# Patient Record
Sex: Female | Born: 1940 | Race: White | Hispanic: No | Smoking: Never smoker
Health system: Southern US, Community
[De-identification: ages and names within clinical notes are randomized; demographics above are authoritative.]

## PROBLEM LIST (undated history)

## (undated) DIAGNOSIS — M1990 Unspecified osteoarthritis, unspecified site: Secondary | ICD-10-CM

## (undated) DIAGNOSIS — F32A Depression, unspecified: Secondary | ICD-10-CM

## (undated) DIAGNOSIS — I509 Heart failure, unspecified: Secondary | ICD-10-CM

## (undated) DIAGNOSIS — J449 Chronic obstructive pulmonary disease, unspecified: Secondary | ICD-10-CM

## (undated) DIAGNOSIS — I10 Essential (primary) hypertension: Secondary | ICD-10-CM

## (undated) DIAGNOSIS — F419 Anxiety disorder, unspecified: Secondary | ICD-10-CM

## (undated) DIAGNOSIS — F329 Major depressive disorder, single episode, unspecified: Secondary | ICD-10-CM

## (undated) DIAGNOSIS — E119 Type 2 diabetes mellitus without complications: Secondary | ICD-10-CM

## (undated) HISTORY — PX: JOINT REPLACEMENT: SHX530

## (undated) HISTORY — PX: EYE SURGERY: SHX253

---

## 2013-08-15 DIAGNOSIS — E7800 Pure hypercholesterolemia, unspecified: Secondary | ICD-10-CM | POA: Diagnosis present

## 2013-09-21 DIAGNOSIS — I10 Essential (primary) hypertension: Secondary | ICD-10-CM | POA: Insufficient documentation

## 2013-11-09 DIAGNOSIS — M109 Gout, unspecified: Secondary | ICD-10-CM | POA: Insufficient documentation

## 2014-01-28 DIAGNOSIS — J449 Chronic obstructive pulmonary disease, unspecified: Secondary | ICD-10-CM | POA: Insufficient documentation

## 2014-12-19 DIAGNOSIS — R600 Localized edema: Secondary | ICD-10-CM | POA: Insufficient documentation

## 2014-12-31 DIAGNOSIS — R270 Ataxia, unspecified: Secondary | ICD-10-CM | POA: Insufficient documentation

## 2015-08-12 DIAGNOSIS — F411 Generalized anxiety disorder: Secondary | ICD-10-CM | POA: Insufficient documentation

## 2015-09-02 DIAGNOSIS — Z9181 History of falling: Secondary | ICD-10-CM | POA: Insufficient documentation

## 2016-03-09 DIAGNOSIS — F32A Depression, unspecified: Secondary | ICD-10-CM | POA: Insufficient documentation

## 2017-04-27 DIAGNOSIS — E1142 Type 2 diabetes mellitus with diabetic polyneuropathy: Secondary | ICD-10-CM | POA: Insufficient documentation

## 2017-10-08 DIAGNOSIS — R5381 Other malaise: Secondary | ICD-10-CM | POA: Diagnosis present

## 2017-10-08 DIAGNOSIS — I482 Chronic atrial fibrillation, unspecified: Secondary | ICD-10-CM | POA: Diagnosis present

## 2019-05-24 ENCOUNTER — Encounter (HOSPITAL_COMMUNITY): Payer: Self-pay | Admitting: Emergency Medicine

## 2019-05-24 ENCOUNTER — Inpatient Hospital Stay (HOSPITAL_COMMUNITY)
Admission: EM | Admit: 2019-05-24 | Discharge: 2019-05-30 | DRG: 493 | Disposition: A | Discharge status: Skilled Nursing Facility | Payer: Medicare Other | Attending: Student in an Organized Health Care Education/Training Program | Admitting: Student in an Organized Health Care Education/Training Program

## 2019-05-24 ENCOUNTER — Emergency Department (HOSPITAL_COMMUNITY): Payer: Medicare Other

## 2019-05-24 ENCOUNTER — Other Ambulatory Visit: Payer: Self-pay

## 2019-05-24 DIAGNOSIS — R296 Repeated falls: Secondary | ICD-10-CM | POA: Diagnosis present

## 2019-05-24 DIAGNOSIS — M80061A Age-related osteoporosis with current pathological fracture, right lower leg, initial encounter for fracture: Secondary | ICD-10-CM | POA: Diagnosis not present

## 2019-05-24 DIAGNOSIS — E11649 Type 2 diabetes mellitus with hypoglycemia without coma: Secondary | ICD-10-CM | POA: Diagnosis not present

## 2019-05-24 DIAGNOSIS — Z96653 Presence of artificial knee joint, bilateral: Secondary | ICD-10-CM | POA: Diagnosis present

## 2019-05-24 DIAGNOSIS — T148XXA Other injury of unspecified body region, initial encounter: Secondary | ICD-10-CM

## 2019-05-24 DIAGNOSIS — I428 Other cardiomyopathies: Secondary | ICD-10-CM | POA: Diagnosis present

## 2019-05-24 DIAGNOSIS — W19XXXA Unspecified fall, initial encounter: Secondary | ICD-10-CM | POA: Diagnosis not present

## 2019-05-24 DIAGNOSIS — S82899A Other fracture of unspecified lower leg, initial encounter for closed fracture: Secondary | ICD-10-CM

## 2019-05-24 DIAGNOSIS — I4811 Longstanding persistent atrial fibrillation: Secondary | ICD-10-CM

## 2019-05-24 DIAGNOSIS — E7800 Pure hypercholesterolemia, unspecified: Secondary | ICD-10-CM | POA: Diagnosis present

## 2019-05-24 DIAGNOSIS — S82431A Displaced oblique fracture of shaft of right fibula, initial encounter for closed fracture: Secondary | ICD-10-CM | POA: Diagnosis present

## 2019-05-24 DIAGNOSIS — Z794 Long term (current) use of insulin: Secondary | ICD-10-CM

## 2019-05-24 DIAGNOSIS — I4821 Permanent atrial fibrillation: Secondary | ICD-10-CM | POA: Diagnosis present

## 2019-05-24 DIAGNOSIS — N179 Acute kidney failure, unspecified: Secondary | ICD-10-CM | POA: Diagnosis present

## 2019-05-24 DIAGNOSIS — B372 Candidiasis of skin and nail: Secondary | ICD-10-CM | POA: Diagnosis present

## 2019-05-24 DIAGNOSIS — Z20828 Contact with and (suspected) exposure to other viral communicable diseases: Secondary | ICD-10-CM | POA: Diagnosis present

## 2019-05-24 DIAGNOSIS — S82401A Unspecified fracture of shaft of right fibula, initial encounter for closed fracture: Secondary | ICD-10-CM

## 2019-05-24 DIAGNOSIS — Y92008 Other place in unspecified non-institutional (private) residence as the place of occurrence of the external cause: Secondary | ICD-10-CM | POA: Diagnosis not present

## 2019-05-24 DIAGNOSIS — E1165 Type 2 diabetes mellitus with hyperglycemia: Secondary | ICD-10-CM | POA: Diagnosis present

## 2019-05-24 DIAGNOSIS — D62 Acute posthemorrhagic anemia: Secondary | ICD-10-CM | POA: Diagnosis not present

## 2019-05-24 DIAGNOSIS — Z23 Encounter for immunization: Secondary | ICD-10-CM | POA: Diagnosis present

## 2019-05-24 DIAGNOSIS — Z79899 Other long term (current) drug therapy: Secondary | ICD-10-CM | POA: Diagnosis not present

## 2019-05-24 DIAGNOSIS — Z6841 Body Mass Index (BMI) 40.0 and over, adult: Secondary | ICD-10-CM | POA: Diagnosis not present

## 2019-05-24 DIAGNOSIS — W010XXA Fall on same level from slipping, tripping and stumbling without subsequent striking against object, initial encounter: Secondary | ICD-10-CM | POA: Diagnosis present

## 2019-05-24 DIAGNOSIS — I5042 Chronic combined systolic (congestive) and diastolic (congestive) heart failure: Secondary | ICD-10-CM | POA: Diagnosis present

## 2019-05-24 DIAGNOSIS — M5136 Other intervertebral disc degeneration, lumbar region: Secondary | ICD-10-CM | POA: Diagnosis not present

## 2019-05-24 DIAGNOSIS — Z419 Encounter for procedure for purposes other than remedying health state, unspecified: Secondary | ICD-10-CM

## 2019-05-24 DIAGNOSIS — E1140 Type 2 diabetes mellitus with diabetic neuropathy, unspecified: Secondary | ICD-10-CM | POA: Diagnosis present

## 2019-05-24 DIAGNOSIS — S82201A Unspecified fracture of shaft of right tibia, initial encounter for closed fracture: Secondary | ICD-10-CM | POA: Diagnosis not present

## 2019-05-24 DIAGNOSIS — J449 Chronic obstructive pulmonary disease, unspecified: Secondary | ICD-10-CM | POA: Diagnosis present

## 2019-05-24 DIAGNOSIS — I5020 Unspecified systolic (congestive) heart failure: Secondary | ICD-10-CM | POA: Diagnosis not present

## 2019-05-24 DIAGNOSIS — Z8719 Personal history of other diseases of the digestive system: Secondary | ICD-10-CM | POA: Diagnosis not present

## 2019-05-24 DIAGNOSIS — I340 Nonrheumatic mitral (valve) insufficiency: Secondary | ICD-10-CM | POA: Diagnosis not present

## 2019-05-24 DIAGNOSIS — S82231A Displaced oblique fracture of shaft of right tibia, initial encounter for closed fracture: Secondary | ICD-10-CM | POA: Diagnosis present

## 2019-05-24 DIAGNOSIS — E1169 Type 2 diabetes mellitus with other specified complication: Secondary | ICD-10-CM | POA: Diagnosis present

## 2019-05-24 DIAGNOSIS — Z9181 History of falling: Secondary | ICD-10-CM | POA: Diagnosis not present

## 2019-05-24 DIAGNOSIS — E118 Type 2 diabetes mellitus with unspecified complications: Secondary | ICD-10-CM | POA: Diagnosis not present

## 2019-05-24 DIAGNOSIS — F329 Major depressive disorder, single episode, unspecified: Secondary | ICD-10-CM | POA: Diagnosis present

## 2019-05-24 DIAGNOSIS — R5381 Other malaise: Secondary | ICD-10-CM | POA: Diagnosis present

## 2019-05-24 DIAGNOSIS — I482 Chronic atrial fibrillation, unspecified: Secondary | ICD-10-CM | POA: Diagnosis present

## 2019-05-24 DIAGNOSIS — M79661 Pain in right lower leg: Secondary | ICD-10-CM | POA: Diagnosis present

## 2019-05-24 DIAGNOSIS — M810 Age-related osteoporosis without current pathological fracture: Secondary | ICD-10-CM | POA: Diagnosis present

## 2019-05-24 DIAGNOSIS — Y9301 Activity, walking, marching and hiking: Secondary | ICD-10-CM | POA: Diagnosis present

## 2019-05-24 DIAGNOSIS — I10 Essential (primary) hypertension: Secondary | ICD-10-CM | POA: Diagnosis not present

## 2019-05-24 DIAGNOSIS — I110 Hypertensive heart disease with heart failure: Secondary | ICD-10-CM | POA: Diagnosis present

## 2019-05-24 HISTORY — DX: Major depressive disorder, single episode, unspecified: F32.9

## 2019-05-24 HISTORY — DX: Depression, unspecified: F32.A

## 2019-05-24 HISTORY — DX: Type 2 diabetes mellitus without complications: E11.9

## 2019-05-24 HISTORY — DX: Anxiety disorder, unspecified: F41.9

## 2019-05-24 HISTORY — DX: Essential (primary) hypertension: I10

## 2019-05-24 HISTORY — DX: Other fracture of unspecified lower leg, initial encounter for closed fracture: S82.899A

## 2019-05-24 HISTORY — DX: Heart failure, unspecified: I50.9

## 2019-05-24 HISTORY — DX: Chronic obstructive pulmonary disease, unspecified: J44.9

## 2019-05-24 HISTORY — DX: Unspecified osteoarthritis, unspecified site: M19.90

## 2019-05-24 LAB — BASIC METABOLIC PANEL
Anion gap: 10 (ref 5–15)
BUN: 47 mg/dL — ABNORMAL HIGH (ref 8–23)
CO2: 23 mmol/L (ref 22–32)
Calcium: 8.8 mg/dL — ABNORMAL LOW (ref 8.9–10.3)
Chloride: 99 mmol/L (ref 98–111)
Creatinine, Ser: 1.33 mg/dL — ABNORMAL HIGH (ref 0.44–1.00)
GFR calc Af Amer: 45 mL/min — ABNORMAL LOW (ref >60)
GFR calc non Af Amer: 39 mL/min — ABNORMAL LOW (ref >60)
Glucose, Bld: 139 mg/dL — ABNORMAL HIGH (ref 70–99)
Potassium: 4.1 mmol/L (ref 3.5–5.1)
Sodium: 132 mmol/L — ABNORMAL LOW (ref 135–145)

## 2019-05-24 LAB — CBC WITH DIFFERENTIAL/PLATELET
Abs Immature Granulocytes: 0.06 10*3/uL (ref 0.00–0.07)
Basophils Absolute: 0.1 10*3/uL (ref 0.0–0.1)
Basophils Relative: 1 %
Eosinophils Absolute: 0.2 10*3/uL (ref 0.0–0.5)
Eosinophils Relative: 3 %
HCT: 39.4 % (ref 36.0–46.0)
Hemoglobin: 12.7 g/dL (ref 12.0–15.0)
Immature Granulocytes: 1 %
Lymphocytes Relative: 17 %
Lymphs Abs: 1.3 10*3/uL (ref 0.7–4.0)
MCH: 31.1 pg (ref 26.0–34.0)
MCHC: 32.2 g/dL (ref 30.0–36.0)
MCV: 96.3 fL (ref 80.0–100.0)
Monocytes Absolute: 0.8 10*3/uL (ref 0.1–1.0)
Monocytes Relative: 10 %
Neutro Abs: 5.4 10*3/uL (ref 1.7–7.7)
Neutrophils Relative %: 68 %
Platelets: 234 10*3/uL (ref 150–400)
RBC: 4.09 MIL/uL (ref 3.87–5.11)
RDW: 13.5 % (ref 11.5–15.5)
WBC: 7.8 10*3/uL (ref 4.0–10.5)
nRBC: 0 % (ref 0.0–0.2)

## 2019-05-24 LAB — CBG MONITORING, ED: Glucose-Capillary: 128 mg/dL — ABNORMAL HIGH (ref 70–99)

## 2019-05-24 LAB — GLUCOSE, CAPILLARY: Glucose-Capillary: 232 mg/dL — ABNORMAL HIGH (ref 70–99)

## 2019-05-24 LAB — PROTIME-INR
INR: 1 (ref 0.8–1.2)
Prothrombin Time: 13.4 seconds (ref 11.4–15.2)

## 2019-05-24 LAB — HEMOGLOBIN A1C
Hgb A1c MFr Bld: 14.6 % — ABNORMAL HIGH (ref 4.8–5.6)
Mean Plasma Glucose: 372.32 mg/dL

## 2019-05-24 LAB — MAGNESIUM: Magnesium: 2.2 mg/dL (ref 1.7–2.4)

## 2019-05-24 LAB — SARS CORONAVIRUS 2 (TAT 6-24 HRS): SARS Coronavirus 2: NEGATIVE

## 2019-05-24 MED ORDER — SENNOSIDES-DOCUSATE SODIUM 8.6-50 MG PO TABS
1.0000 | ORAL_TABLET | Freq: Every evening | ORAL | Status: DC | PRN
  Administered 2019-05-25: 1 via ORAL
  Filled 2019-05-24 (×2): qty 1

## 2019-05-24 MED ORDER — HYDROMORPHONE HCL 1 MG/ML IJ SOLN
0.5000 mg | Freq: Four times a day (QID) | INTRAMUSCULAR | Status: DC | PRN
  Administered 2019-05-24 – 2019-05-25 (×2): 0.5 mg via INTRAVENOUS
  Filled 2019-05-24 (×2): qty 1

## 2019-05-24 MED ORDER — ACETAMINOPHEN 325 MG PO TABS
650.0000 mg | ORAL_TABLET | Freq: Four times a day (QID) | ORAL | Status: DC | PRN
  Administered 2019-05-24: 650 mg via ORAL
  Filled 2019-05-24: qty 2

## 2019-05-24 MED ORDER — PROMETHAZINE HCL 25 MG PO TABS: 12.5000 mg | ORAL_TABLET | Freq: Four times a day (QID) | ORAL | Status: DC | PRN

## 2019-05-24 MED ORDER — INSULIN GLARGINE 100 UNIT/ML ~~LOC~~ SOLN
70.0000 [IU] | Freq: Every day | SUBCUTANEOUS | Status: DC
  Filled 2019-05-24: qty 0.7

## 2019-05-24 MED ORDER — INFLUENZA VAC A&B SA ADJ QUAD 0.5 ML IM PRSY
0.5000 mL | PREFILLED_SYRINGE | INTRAMUSCULAR | Status: AC
  Administered 2019-05-25: 0.5 mL via INTRAMUSCULAR
  Filled 2019-05-24: qty 0.5

## 2019-05-24 MED ORDER — POVIDONE-IODINE 10 % EX SWAB: 2.0000 "application " | Freq: Once | CUTANEOUS | Status: DC

## 2019-05-24 MED ORDER — CEFAZOLIN SODIUM-DEXTROSE 2-4 GM/100ML-% IV SOLN
2.0000 g | INTRAVENOUS | Status: AC
  Administered 2019-05-25: 2 g via INTRAVENOUS
  Filled 2019-05-24: qty 100

## 2019-05-24 MED ORDER — GABAPENTIN 400 MG PO CAPS
400.0000 mg | ORAL_CAPSULE | Freq: Every day | ORAL | Status: DC | PRN
  Administered 2019-05-25: 400 mg via ORAL
  Filled 2019-05-24: qty 1

## 2019-05-24 MED ORDER — ASPIRIN 325 MG PO TABS
325.0000 mg | ORAL_TABLET | Freq: Every day | ORAL | Status: DC
  Administered 2019-05-24: 325 mg via ORAL
  Filled 2019-05-24: qty 1

## 2019-05-24 MED ORDER — DIGOXIN 125 MCG PO TABS
0.1250 mg | ORAL_TABLET | ORAL | Status: DC
  Administered 2019-05-26 – 2019-05-28 (×2): 0.125 mg via ORAL
  Filled 2019-05-24 (×2): qty 1

## 2019-05-24 MED ORDER — CLOTRIMAZOLE 1 % EX CREA
TOPICAL_CREAM | Freq: Two times a day (BID) | CUTANEOUS | Status: DC
  Administered 2019-05-24 – 2019-05-26 (×4): via TOPICAL
  Administered 2019-05-27: 1 via TOPICAL
  Administered 2019-05-27 – 2019-05-29 (×5): via TOPICAL
  Administered 2019-05-30: 1 via TOPICAL
  Filled 2019-05-24 (×2): qty 15

## 2019-05-24 MED ORDER — INSULIN ASPART 100 UNIT/ML ~~LOC~~ SOLN
0.0000 [IU] | Freq: Three times a day (TID) | SUBCUTANEOUS | Status: DC
  Administered 2019-05-25: 3 [IU] via SUBCUTANEOUS
  Administered 2019-05-25: 5 [IU] via SUBCUTANEOUS
  Administered 2019-05-26: 11 [IU] via SUBCUTANEOUS
  Administered 2019-05-26: 5 [IU] via SUBCUTANEOUS
  Administered 2019-05-26: 11 [IU] via SUBCUTANEOUS
  Administered 2019-05-27 (×2): 5 [IU] via SUBCUTANEOUS
  Administered 2019-05-27: 3 [IU] via SUBCUTANEOUS
  Administered 2019-05-28: 5 [IU] via SUBCUTANEOUS
  Administered 2019-05-28: 12:00:00 2 [IU] via SUBCUTANEOUS
  Administered 2019-05-28 – 2019-05-29 (×2): 3 [IU] via SUBCUTANEOUS
  Administered 2019-05-30: 2 [IU] via SUBCUTANEOUS

## 2019-05-24 MED ORDER — ACETAMINOPHEN 650 MG RE SUPP: 650.0000 mg | Freq: Four times a day (QID) | RECTAL | Status: DC | PRN

## 2019-05-24 MED ORDER — ESCITALOPRAM OXALATE 10 MG PO TABS
10.0000 mg | ORAL_TABLET | Freq: Every day | ORAL | Status: DC
  Administered 2019-05-24 – 2019-05-30 (×6): 10 mg via ORAL
  Filled 2019-05-24 (×6): qty 1

## 2019-05-24 MED ORDER — CHLORHEXIDINE GLUCONATE 4 % EX LIQD: 60.0000 mL | Freq: Once | CUTANEOUS | Status: DC

## 2019-05-24 MED ORDER — MOMETASONE FURO-FORMOTEROL FUM 100-5 MCG/ACT IN AERO
2.0000 | INHALATION_SPRAY | Freq: Two times a day (BID) | RESPIRATORY_TRACT | Status: DC
  Administered 2019-05-24 – 2019-05-30 (×11): 2 via RESPIRATORY_TRACT
  Filled 2019-05-24: qty 8.8

## 2019-05-24 MED ORDER — ENSURE PRE-SURGERY PO LIQD
296.0000 mL | Freq: Once | ORAL | Status: AC
  Administered 2019-05-25: 296 mL via ORAL
  Filled 2019-05-24: qty 296

## 2019-05-24 MED ORDER — FENTANYL CITRATE (PF) 100 MCG/2ML IJ SOLN
50.0000 ug | Freq: Once | INTRAMUSCULAR | Status: AC
  Administered 2019-05-24: 50 ug via INTRAVENOUS
  Filled 2019-05-24: qty 2

## 2019-05-24 MED ORDER — HEPARIN SODIUM (PORCINE) 5000 UNIT/ML IJ SOLN
5000.0000 [IU] | Freq: Three times a day (TID) | INTRAMUSCULAR | Status: DC
  Administered 2019-05-24 – 2019-05-25 (×2): 5000 [IU] via SUBCUTANEOUS
  Filled 2019-05-24 (×2): qty 1

## 2019-05-24 MED ORDER — FUROSEMIDE 40 MG PO TABS
80.0000 mg | ORAL_TABLET | Freq: Every day | ORAL | Status: DC
  Administered 2019-05-24 – 2019-05-26 (×2): 80 mg via ORAL
  Filled 2019-05-24: qty 2
  Filled 2019-05-24: qty 4

## 2019-05-24 MED ORDER — ATORVASTATIN CALCIUM 10 MG PO TABS
20.0000 mg | ORAL_TABLET | Freq: Every day | ORAL | Status: DC
  Administered 2019-05-24 – 2019-05-27 (×4): 20 mg via ORAL
  Filled 2019-05-24 (×4): qty 2

## 2019-05-24 MED ORDER — PNEUMOCOCCAL VAC POLYVALENT 25 MCG/0.5ML IJ INJ
0.5000 mL | INJECTION | INTRAMUSCULAR | Status: AC
  Administered 2019-05-29: 0.5 mL via INTRAMUSCULAR
  Filled 2019-05-24 (×2): qty 0.5

## 2019-05-24 NOTE — Progress Notes (Signed)
Patient arrived to 5N15 from the ED by stretcher. Patient AOx4 VSS and is currently not in pain. Will continue to monitor the patient.

## 2019-05-24 NOTE — Progress Notes (Signed)
Orthopedic Tech Progress Note Patient Details:  Carly Rosario 20-Apr-1941 [id number]  Ortho Devices Type of Ortho Device: Ace wrap, Short leg splint Ortho Device/Splint Location: right Ortho Device/Splint Interventions: Application   Post Interventions Patient Tolerated: Well Instructions Provided: Care of device   Carly Rosario 05/24/2019, 12:19 PM

## 2019-05-24 NOTE — ED Notes (Signed)
Dinner tray has been ordered 

## 2019-05-24 NOTE — H&P (Addendum)
Date: 05/24/2019               Patient Name:  Carly Rosario MRN: [id number]  DOB: 19-Sep-1940 Age / Sex: 78 y.o., female   PCP: Concepcion Elk, MD         Medical Service: Internal Medicine Teaching Service         Attending Physician: Dr. Evette Doffing, Mallie Mussel, *    First Contact: Dr. Ronnald Ramp Pager: [phone]  Second Contact: Dr. 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 The patient stated that she was ambulating with a rolling four wheel walker this morning when she twisted her right ankle and prompted her fall. She landed on her bottom with her leg/foot bent under her bottom. She did not hit her head.   Patient denies any dizziness/lighheadedness, palpitations, dyspnea or blurry vision prior to the fall. Of note, the patient states that she has had one additional fall a few months ago. She cannot recall the mechanism of the fall.  The patient states that she has severe, throbbing pain in her right ankle that is non radiating, worsened when she places weight on her right ankle. She does not have any numbness or tingling.   In the ED, the patient was afebrile, pulse ranging 50-60s, normal respirations, normotensive, normal saturation. Right tibia/fibula xray showed a fracture and orthopedic surgery was consulted.   Meds:  Digoxin 158mcg tablet  Lipitor 20mg  daily  Lisinopril 40mg  daily  ASA 325mg  daily  Toujeo 70 units daily  Novolog 14 units TID before meals  Symbicort  Duonebs  No outpatient medications have been marked as taking for the  05/24/19 encounter [facility] Encounter).   Allergies: Allergies as of 05/24/2019 - Review Complete 05/24/2019  Allergen Reaction Noted   Nickel Other (See Comments) and Rash 12/19/2013   Tetracyclines & related Nausea And Vomiting 09/07/2016   Allopurinol Other (See Comments) 12/19/2013   Citalopram hydrobromide Other (See Comments) 12/19/2013   Metformin Other (See Comments) 10/07/2017   Pravastatin Other (See Comments) 12/19/2013   Propoxyphene Nausea And Vomiting 12/19/2013   Sulfa antibiotics Nausea And Vomiting 12/19/2013   Past Medical History:  Diagnosis Date   Anxiety    Arthritis    CHF (congestive heart failure) (HCC)    COPD (chronic obstructive pulmonary disease) ([place])    Depressive disorder    Diabetes mellitus without complication ([place])    Hypertension    Past surgical history:  Bilateral total knee replacements  Family History:   CHF: father  HTN: father, sister DM: sister  Denies any family history of CAD, CKD, Liver disease, or cancers  Social History:  Lives in [place], lives alone. Son lives aroudn the corner and is close to her neighbors.  Needs help with some ADLS like cleaning. She is able to drive herself, cook, and get groceries.   Never smoker, denies etoh, marijuana, or illicit drug use  Review of Systems:  Review of Systems  Constitutional: Negative for chills, fever and malaise/fatigue.  Eyes: Negative for blurred vision.  Respiratory: Negative for cough and shortness of  breath.   Cardiovascular: Negative for chest pain.  Gastrointestinal: Negative for abdominal pain, nausea and vomiting.  Musculoskeletal: Positive for back pain and joint pain.  Neurological: Negative for dizziness and headaches.  Psychiatric/Behavioral: Positive for depression.   Physical Exam: Blood pressure (!) 119/58, pulse (!) 36, temperature (!) 97.5 F (36.4 C), temperature source Oral, resp. rate 19, SpO2 96 %.  Physical Exam  Constitutional:  Appears well-developed and well-nourished. Obese body habitus. No distress.  HENT:  Head: Normocephalic and atraumatic.  Eyes: Conjunctivae are normal.  Cardiovascular: Normal rate, atrial fibrillation rhythm and normal heart sounds.  Respiratory: Effort normal and breath sounds normal. No respiratory distress. No wheezes.  GI: Soft. Bowel sounds are normal. No distension. There is no tenderness.  Musculoskeletal: No edema. Right lower extremity in ace wrap.  Neurological: Is alert. 4/5 strength in left lower extremity, Limited rom in right lower extremity. Able to move toes in bilateral feet. Sensation intact in bilateral lower extremity.  Skin: Not diaphoretic. No erythema. No pitting edema. Erythema in buttocks, genital area, and upper thigh Psychiatric: Normal mood and affect. Behavior is normal. Judgment and thought content normal.      LABS: BMP: na 132, k 4.1, bun 47, cr 1.33 CBC: wbc 7.8, hb 12.7, hct 39.7, plt 234 PT 13.4, INR 1.0 Mg pending COVID19 pending   EKG: atrial fibrillation rhythm with regular rate  CXR: Not done  Tibia/Fibula xray, Right ankle xray 05/24/19 Oblique mildly displaced fracture of distal right fibula and tibia, nondisplaced horizontal fracture through the medial malleolus  Right foot xray 05/24/19 No acute fracture or dislocation of right foot  Assessment & Plan by Problem: Active Problems:   Right tibial fracture  Carly Rosario is a 78 y.o female with permanent afib, nonischemic cardiomyopathy, diabetes type 2, copd, lumbar degeneration, htn who presents post fall causing right tibia and fibular fracture.   Right Tibia/Fibula Fracture post fall Patient post a mechanical fall. Unknown etiology of fall. Will investigate whether due to cardiac cause vs. Diabetic peripheral neuropathy vs gait imbalance vs extrinsic factors (clutter in home). Not on any contributory medications.   Due to fracture from low height will need to examine if patient  possibility of stress fracture secondary to osteoporosis.  Orthopedics has evaluated patient and plan on doing a fixation tomorrow 05/25/19. The patient has been placed in a ACE wrap and short leg splint. She has a 10.1% 30 day risk of death, MI per revised cardiac risk index.   -Orthopedics following -PT and OT evaluation  -orthostatic vital signs  -cardiac monitoring  -Dilaudid 0.5mg  q6hrs prn -bone scan outpatient to establish baseline, consider bisphosphonate tx  Permanent Atrial Fibrillation  Rate controlled. CHADSvasc score of 6 which equates to 9.7% stroke risk per year. Patient is not on any rate controlling agent or anticoagulant. She had a recent zio patch placed in September 2020 due to feeling that her heart was racing and beating irregularly.  Candidal intertrigo Present in genital, buttocks, and upper thighs.  -apply clotrimazole bid  HFrEF Last echo done March 2020 showed ef 40-45%, severely dilated left ventricle, mild mitral annular calcification, mild to moderate mitral regurgitation, mild global hypokinesis of left ventricle, ivc dilation.   Appears euvolemic on exam.   -continue lasix 80mg  qd -continue digoxin 0.125mg  every other day  Diabetes Mellitus type 2 Patient's last a1c not able to be found. She takes lantus 70u qd and humalog 9u tidwc at home.   -lantus 30u qhs  -ssi -glucose  monitoring -HbA1c pending  Hypertension  Patient's blood pressure has ranged 90-110s/ 50-60s.   -hold lisinopril   COPD  -continue symbicort 2puffs bid  Depression   -continue lexapro 10mg  qd  Dispo: Admit patient to Inpatient with expected length of stay greater than 2 midnights.  SignedLorenso Courier, MD 05/24/2019, 2:20 PM  Pager: [phone]

## 2019-05-24 NOTE — ED Provider Notes (Signed)
[facility] EMERGENCY DEPARTMENT Provider Note   CSN: [id number] Arrival date & time: 05/24/19  1007     History   Chief Complaint Chief Complaint  Patient presents with  . Fall    HPI Carly Rosario is a 78 y.o. female.  She said she was ambulating with a walker when she twisted her right ankle and fell.  She has been unable to ambulate since the fall.  She does denies striking her head or neck or back and has no pain there.  She rates the pain as severe in her ankle if she tries to move it.  No numbness or weakness.  Not on anticoagulation.  Lives alone but her son lives downstairs.     The history is provided by the patient and the EMS personnel.  Ankle Pain Location:  Ankle Time since incident:  45 minutes Injury: yes   Mechanism of injury: fall   Fall:    Fall occurred:  Walking Ankle location:  R ankle Pain details:    Quality:  Throbbing   Radiates to:  Does not radiate   Severity:  Severe   Onset quality:  Sudden   Timing:  Constant   Progression:  Unchanged Chronicity:  New Dislocation: no   Relieved by:  None tried Worsened by:  Bearing weight Ineffective treatments:  Rest Associated symptoms: no back pain, no fever, no muscle weakness, no neck pain and no numbness   Risk factors: obesity     No past medical history on file.  There are no active problems to display for this patient.   Past Surgical History:  Procedure Laterality Date  . EYE SURGERY    . JOINT REPLACEMENT       OB History   No obstetric history on file.      Home Medications    Prior to Admission medications   Not on File    Family History No family history on file.  Social History Social History   Tobacco Use  . Smoking status: Never Smoker  . Smokeless tobacco: Never Used  Substance Use Topics  . Alcohol use: Not Currently  . Drug use: Never     Allergies   Patient has no allergy information on record.   Review of Systems Review of  Systems  Constitutional: Negative for fever.  HENT: Negative for sore throat.   Eyes: Negative for visual disturbance.  Respiratory: Negative for shortness of breath.   Cardiovascular: Negative for chest pain.  Gastrointestinal: Negative for abdominal pain.  Genitourinary: Negative for dysuria.  Musculoskeletal: Negative for back pain and neck pain.  Skin: Negative for rash.  Neurological: Negative for headaches.     Physical Exam Updated Vital Signs BP 114/69 (BP Location: Right Arm)   Pulse (!) 46   Temp 97.9 F (36.6 C) (Oral)   Resp 19   SpO2 99%   Physical Exam Vitals signs and nursing note reviewed.  Constitutional:      General: She is not in acute distress.    Appearance: She is well-developed.  HENT:     Head: Normocephalic and atraumatic.  Eyes:     Conjunctiva/sclera: Conjunctivae normal.  Neck:     Musculoskeletal: Neck supple.  Cardiovascular:     Rate and Rhythm: Normal rate. Rhythm irregular.     Heart sounds: No murmur.  Pulmonary:     Effort: Pulmonary effort is normal. No respiratory distress.     Breath sounds: Normal breath sounds.  Abdominal:  Palpations: Abdomen is soft.     Tenderness: There is no abdominal tenderness.  Musculoskeletal:        General: Tenderness and signs of injury present.     Comments: Full range of motion of upper extremities without any pain or limitation.  Full range of motion of left lower extremity without any pain or limitations.  Right lower extremity nontender right hip and knee.  She is got some bruising and swelling of her lower leg just above her ankle and diffuse tenderness anterior lateral.  DP pulses intact and sensation intact to light touch.  Skin:    General: Skin is warm and dry.  Neurological:     General: No focal deficit present.     Mental Status: She is alert.     Sensory: No sensory deficit.     Motor: No weakness.      ED Treatments / Results  Labs (all labs ordered are listed, but only  abnormal results are displayed) Labs Reviewed  BASIC METABOLIC PANEL - Abnormal; Notable for the following components:      Result Value   Sodium 132 (*)    Glucose, Bld 139 (*)    BUN 47 (*)    Creatinine, Ser 1.33 (*)    Calcium 8.8 (*)    GFR calc non Af Amer 39 (*)    GFR calc Af Amer 45 (*)    All other components within normal limits  HEMOGLOBIN A1C - Abnormal; Notable for the following components:   Hgb A1c MFr Bld 14.6 (*)    All other components within normal limits  CBG MONITORING, ED - Abnormal; Notable for the following components:   Glucose-Capillary 128 (*)    All other components within normal limits  SARS CORONAVIRUS 2 (TAT 6-24 HRS)  CBC WITH DIFFERENTIAL/PLATELET  PROTIME-INR  MAGNESIUM  BASIC METABOLIC PANEL  CBC    EKG EKG Interpretation  Date/Time:  Thursday May 24 2019 10:57:04 EST Ventricular Rate:  45 PR Interval:    QRS Duration: 166 QT Interval:  489 QTC Calculation: 423 R Axis:   -112 Text Interpretation: Atrial fibrillation Ventricular bigeminy Nonspecific IVCD with LAD No old tracing to compare Confirmed by Meridee Score [phone]) on 05/24/2019 11:01:49 AM   Radiology Dg Tibia/fibula Right  Result Date: 05/24/2019 CLINICAL DATA:  Fall, ankle EXAM: RIGHT TIBIA AND FIBULA - 2 VIEW; RIGHT ANKLE - COMPLETE 3+ VIEW COMPARISON:  None. FINDINGS: There are oblique fractures of the distal fibula and tibia involving the diaphyses. Approximately 2 mm of medial displacement of the fibula. Approximately 7 mm medial displacement of the tibia with mild apex anterior angulation. Nondisplaced fracture through the medial malleolus. Partially imaged total right knee arthroplasty is unremarkable. IMPRESSION: Oblique, mildly displaced fractures of the distal right fibula and tibia. Nondisplaced horizontal fracture through the medial malleolus. Electronically Signed   By: Guadlupe Spanish M.D.   On: 05/24/2019 11:02   Dg Ankle Complete Right  Result Date:  05/24/2019 CLINICAL DATA:  Fall, ankle EXAM: RIGHT TIBIA AND FIBULA - 2 VIEW; RIGHT ANKLE - COMPLETE 3+ VIEW COMPARISON:  None. FINDINGS: There are oblique fractures of the distal fibula and tibia involving the diaphyses. Approximately 2 mm of medial displacement of the fibula. Approximately 7 mm medial displacement of the tibia with mild apex anterior angulation. Nondisplaced fracture through the medial malleolus. Partially imaged total right knee arthroplasty is unremarkable. IMPRESSION: Oblique, mildly displaced fractures of the distal right fibula and tibia. Nondisplaced horizontal fracture through the  medial malleolus. Electronically Signed   By: Guadlupe Spanish M.D.   On: 05/24/2019 11:02   Dg Foot 2 Views Right  Result Date: 05/24/2019 CLINICAL DATA:  Fall EXAM: RIGHT FOOT - 2 VIEW COMPARISON:  None. FINDINGS: Acute fractures of the tibia and fibula as described on separate radiographs. There is no acute fracture or dislocation involving the foot. Decreased osseous mineralization. IMPRESSION: No acute fracture or dislocation of the right foot. Electronically Signed   By: Guadlupe Spanish M.D.   On: 05/24/2019 11:05    Procedures Procedures (including critical care time)  Medications Ordered in ED Medications  fentaNYL (SUBLIMAZE) injection 50 mcg (50 mcg Intravenous Given 05/24/19 1124)     Initial Impression / Assessment and Plan / ED Course  I have reviewed the triage vital signs and the nursing notes.  Pertinent labs & imaging results that were available during my care of the patient were reviewed by me and considered in my medical decision making (see chart for details).  Clinical Course as of May 23 1226  Thu May 24, 2019  [id number] 78 year old female using walker at baseline mechanical fall injuring right ankle.  Differential includes fracture, dislocation, sprain.  Getting x-rays.  Also lab work ordered as may need admission or placement.   [MB]  1101 Patient's x-rays interpreted by me  as distal tibia and fibular displaced fracture.  Orthopedic consulted.   [MB]  1148 PA Jeffries from orthopedics has evaluated the patient.  Plan is to splint the patient and see if can ambulate with walker.  If unable or unsafe she needs to be admitted to medical service and Ortho will follow along.   [MB]  1225 Discussed with internal medicine team who will evaluate the patient for admission.   [MB]    Clinical Course User Index [MB] Terrilee Files, MD     CHA2DS2/VAS Stroke Risk Points      N/A >= 2 Points: High Risk  1 - 1.99 Points: Medium Risk  0 Points: Low Risk    A final score could not be computed because of missing components.: Last  Change: N/A     This score determines the patient's risk of having a stroke if the  patient has atrial fibrillation.      This score is not applicable to this patient. Components are not  calculated.      Final Clinical Impressions(s) / ED Diagnoses   Final diagnoses:  Tibia/fibula fracture, right, closed, initial encounter  Longstanding persistent atrial fibrillation [facility])    ED Discharge Orders    None       Terrilee Files, MD 05/24/19 1914

## 2019-05-24 NOTE — Consult Note (Signed)
Reason for Consult:Right tib/fib fx Referring Physician: Britnie Sitterly is an 78 y.o. female.  HPI: Carly Rosario was walking down her hallway and fell. She's not sure what caused her to fall but denies syncope/presyncope. Her leg was caught under her and had immediate pain. She could not get up. She was brought to the ED where x-rays showed a tib/fib fx and orthopedic surgery was consulted. She normally ambulates with a RW and was using it this morning but had put it to one side for a moment.  Past Medical History:  Diagnosis Date  . Anxiety   . Arthritis   . CHF (congestive heart failure) (HCC)   . COPD (chronic obstructive pulmonary disease) (HCC)   . Depressive disorder   . Diabetes mellitus without complication (HCC)   . Hypertension     No family history on file.  Social History:  has no history on file for tobacco, alcohol, and drug.  Allergies:  Allergies  Allergen Reactions  . Nickel Other (See Comments) and Rash  . Tetracyclines & Related Nausea And Vomiting  . Allopurinol Other (See Comments)  . Citalopram Hydrobromide Other (See Comments)  . Metformin Other (See Comments)    Avoid due to liver damage, per patient  . Pravastatin Other (See Comments)  . Propoxyphene Nausea And Vomiting  . Sulfa Antibiotics Nausea And Vomiting    Medications: I have reviewed the patient's current medications.  Results for orders placed or performed during the hospital encounter of 05/24/19 (from the past 48 hour(s))  Basic metabolic panel     Status: Abnormal   Collection Time: 05/24/19 10:15 AM  Result Value Ref Range   Sodium 132 (L) 135 - 145 mmol/L   Potassium 4.1 3.5 - 5.1 mmol/L   Chloride 99 98 - 111 mmol/L   CO2 23 22 - 32 mmol/L   Glucose, Bld 139 (H) 70 - 99 mg/dL   BUN 47 (H) 8 - 23 mg/dL   Creatinine, Ser 6.16 (H) 0.44 - 1.00 mg/dL   Calcium 8.8 (L) 8.9 - 10.3 mg/dL   GFR calc non Af Amer 39 (L) >60 mL/min   GFR calc Af Amer 45 (L) >60 mL/min   Anion gap 10 5  - 15    Comment: Performed at [facility], [address] [place], [place] [address]  CBC with Differential     Status: None   Collection Time: 05/24/19 10:15 AM  Result Value Ref Range   WBC 7.8 4.0 - 10.5 K/uL   RBC 4.09 3.87 - 5.11 MIL/uL   Hemoglobin 12.7 12.0 - 15.0 g/dL   HCT 02.1 11.5 - 52.0 %   MCV 96.3 80.0 - 100.0 fL   MCH 31.1 26.0 - 34.0 pg   MCHC 32.2 30.0 - 36.0 g/dL   RDW 80.2 23.3 - 61.2 %   Platelets 234 150 - 400 K/uL   nRBC 0.0 0.0 - 0.2 %   Neutrophils Relative % 68 %   Neutro Abs 5.4 1.7 - 7.7 K/uL   Lymphocytes Relative 17 %   Lymphs Abs 1.3 0.7 - 4.0 K/uL   Monocytes Relative 10 %   Monocytes Absolute 0.8 0.1 - 1.0 K/uL   Eosinophils Relative 3 %   Eosinophils Absolute 0.2 0.0 - 0.5 K/uL   Basophils Relative 1 %   Basophils Absolute 0.1 0.0 - 0.1 K/uL   Immature Granulocytes 1 %   Abs Immature Granulocytes 0.06 0.00 - 0.07 K/uL  Comment: Performed at [facility], [address], [place], [place] [address]  Protime-INR     Status: None   Collection Time: 05/24/19 10:15 AM  Result Value Ref Range   Prothrombin Time 13.4 11.4 - 15.2 seconds   INR 1.0 0.8 - 1.2    Comment: (NOTE) INR goal varies based on device and disease states. Performed at [facility], [address] [place], [place] [address]   CBG monitoring, ED     Status: Abnormal   Collection Time: 05/24/19 10:22 AM  Result Value Ref Range   Glucose-Capillary 128 (H) 70 - 99 mg/dL   Comment 1 Notify RN    Comment 2 Document in Chart     Dg Tibia/fibula Right  Result Date: 05/24/2019 CLINICAL DATA:  Fall, ankle EXAM: RIGHT TIBIA AND FIBULA - 2 VIEW; RIGHT ANKLE - COMPLETE 3+ VIEW COMPARISON:  None. FINDINGS: There are oblique fractures of the distal fibula and tibia involving the diaphyses. Approximately 2 mm of medial displacement of the fibula. Approximately 7 mm medial displacement of the tibia with mild apex anterior angulation. Nondisplaced fracture through the  medial malleolus. Partially imaged total right knee arthroplasty is unremarkable. IMPRESSION: Oblique, mildly displaced fractures of the distal right fibula and tibia. Nondisplaced horizontal fracture through the medial malleolus. Electronically Signed   By: Guadlupe Spanish M.D.   On: 05/24/2019 11:02   Dg Ankle Complete Right  Result Date: 05/24/2019 CLINICAL DATA:  Fall, ankle EXAM: RIGHT TIBIA AND FIBULA - 2 VIEW; RIGHT ANKLE - COMPLETE 3+ VIEW COMPARISON:  None. FINDINGS: There are oblique fractures of the distal fibula and tibia involving the diaphyses. Approximately 2 mm of medial displacement of the fibula. Approximately 7 mm medial displacement of the tibia with mild apex anterior angulation. Nondisplaced fracture through the medial malleolus. Partially imaged total right knee arthroplasty is unremarkable. IMPRESSION: Oblique, mildly displaced fractures of the distal right fibula and tibia. Nondisplaced horizontal fracture through the medial malleolus. Electronically Signed   By: Guadlupe Spanish M.D.   On: 05/24/2019 11:02   Dg Foot 2 Views Right  Result Date: 05/24/2019 CLINICAL DATA:  Fall EXAM: RIGHT FOOT - 2 VIEW COMPARISON:  None. FINDINGS: Acute fractures of the tibia and fibula as described on separate radiographs. There is no acute fracture or dislocation involving the foot. Decreased osseous mineralization. IMPRESSION: No acute fracture or dislocation of the right foot. Electronically Signed   By: Guadlupe Spanish M.D.   On: 05/24/2019 11:05    Review of Systems  Constitutional: Negative for weight loss.  HENT: Negative for ear discharge, ear pain, hearing loss and tinnitus.   Eyes: Negative for blurred vision, double vision, photophobia and pain.  Respiratory: Negative for cough, sputum production and shortness of breath.   Cardiovascular: Negative for chest pain.  Gastrointestinal: Negative for abdominal pain, nausea and vomiting.  Genitourinary: Negative for dysuria, flank pain,  frequency and urgency.  Musculoskeletal: Positive for joint pain (Right ankle). Negative for back pain, falls, myalgias and neck pain.  Neurological: Negative for dizziness, tingling, sensory change, focal weakness, loss of consciousness and headaches.  Endo/Heme/Allergies: Does not bruise/bleed easily.  Psychiatric/Behavioral: Negative for depression, memory loss and substance abuse. The patient is not nervous/anxious.    Blood pressure 127/72, pulse (!) 38, temperature (!) 97.5 F (36.4 C), temperature source Oral, resp. rate 17, SpO2 97 %. Physical Exam  Constitutional: She appears well-developed and well-nourished. No distress.  HENT:  Head: Normocephalic and atraumatic.  Eyes:  Conjunctivae are normal. Right eye exhibits no discharge. Left eye exhibits no discharge. No scleral icterus.  Neck: Normal range of motion.  Cardiovascular: Normal rate and regular rhythm.  Respiratory: Effort normal. No respiratory distress.  Musculoskeletal:     Comments: RLE No traumatic wounds or rash, distal lower leg and foot ecchymoses  Mod TTP lower leg  No knee effusion  Knee stable to varus/ valgus and anterior/posterior stress  Sens DPN, SPN, TN intact  Motor EHL, ext, flex, evers 5/5  DP 0, PT 0, 2+ edema  Neurological: She is alert.  Skin: Skin is warm and dry. She is not diaphoretic.  Psychiatric: She has a normal mood and affect. Her behavior is normal.    Assessment/Plan: Right tib/fib fx -- Will splint. If she can ambulate with RW and maintain NWB then should see Dr. Lucia Gaskins in office tomorrow to discuss surgery. If not then will need medical admit and I will try to find surgeon and time for fixation tomorrow. Multiple medical problems including CHF, DM, COPD, HTN, and depression    Lisette Abu, PA-C Orthopedic Surgery [phone] 05/24/2019, 11:23 AM

## 2019-05-24 NOTE — ED Notes (Signed)
ED TO INPATIENT HANDOFF REPORT  ED Nurse Name and Phone #: Jeannett Senior [phone]  S Name/Age/Gender Carly Rosario 78 y.o. female Room/Bed: 049C/049C  Code Status   Code Status: DNR  Home/SNF/Other Home Patient oriented to: self, place, time and situation Is this baseline? Yes   Triage Complete: Triage complete  Chief Complaint fall  Triage Note No notes on file   Allergies Allergies  Allergen Reactions  . Nickel Other (See Comments) and Rash  . Tetracyclines & Related Nausea And Vomiting  . Allopurinol Other (See Comments)  . Citalopram Hydrobromide Other (See Comments)  . Metformin Other (See Comments)    Avoid due to liver damage, per patient  . Pravastatin Other (See Comments)  . Propoxyphene Nausea And Vomiting  . Sulfa Antibiotics Nausea And Vomiting    Level of Care/Admitting Diagnosis ED Disposition    ED Disposition Condition Comment   Admit  Hospital Area: [facility] [[id number]]  Level of Care: Telemetry Medical [104]  Covid Evaluation: Asymptomatic Screening Protocol (No Symptoms)  Diagnosis: Right tibial fracture [735329]  Admitting Physician: Tyson Alias [[id number]]  Attending Physician: Tyson Alias [[id number]]  Estimated length of stay: past midnight tomorrow  Certification:: I certify this patient will need inpatient services for at least 2 midnights  PT Class (Do Not Modify): Inpatient [101]  PT Acc Code (Do Not Modify): Private [1]       B Medical/Surgery History Past Medical History:  Diagnosis Date  . Anxiety   . Arthritis   . CHF (congestive heart failure) (HCC)   . COPD (chronic obstructive pulmonary disease) (HCC)   . Depressive disorder   . Diabetes mellitus without complication (HCC)   . Hypertension    Past Surgical History:  Procedure Laterality Date  . EYE SURGERY    . JOINT REPLACEMENT       A IV Location/Drains/Wounds Patient Lines/Drains/Airways Status   Active Line/Drains/Airways    Name:   Placement date:   Placement time:   Site:   Days:   Peripheral IV 05/24/19 Left Antecubital   05/24/19    1053    Antecubital   less than 1          Intake/Output Last 24 hours No intake or output data in the 24 hours ending 05/24/19 1539  Labs/Imaging Results for orders placed or performed during the hospital encounter of 05/24/19 (from the past 48 hour(s))  Basic metabolic panel     Status: Abnormal   Collection Time: 05/24/19 10:15 AM  Result Value Ref Range   Sodium 132 (L) 135 - 145 mmol/L   Potassium 4.1 3.5 - 5.1 mmol/L   Chloride 99 98 - 111 mmol/L   CO2 23 22 - 32 mmol/L   Glucose, Bld 139 (H) 70 - 99 mg/dL   BUN 47 (H) 8 - 23 mg/dL   Creatinine, Ser 9.79 (H) 0.44 - 1.00 mg/dL   Calcium 8.8 (L) 8.9 - 10.3 mg/dL   GFR calc non Af Amer 39 (L) >60 mL/min   GFR calc Af Amer 45 (L) >60 mL/min   Anion gap 10 5 - 15    Comment: Performed at [facility], [address] [place], [place] [address]  CBC with Differential     Status: None   Collection Time: 05/24/19 10:15 AM  Result Value Ref Range   WBC 7.8 4.0 - 10.5 K/uL   RBC 4.09 3.87 - 5.11 MIL/uL   Hemoglobin 12.7 12.0 - 15.0 g/dL  HCT 39.4 36.0 - 46.0 %   MCV 96.3 80.0 - 100.0 fL   MCH 31.1 26.0 - 34.0 pg   MCHC 32.2 30.0 - 36.0 g/dL   RDW 13.5 11.5 - 15.5 %   Platelets 234 150 - 400 K/uL   nRBC 0.0 0.0 - 0.2 %   Neutrophils Relative % 68 %   Neutro Abs 5.4 1.7 - 7.7 K/uL   Lymphocytes Relative 17 %   Lymphs Abs 1.3 0.7 - 4.0 K/uL   Monocytes Relative 10 %   Monocytes Absolute 0.8 0.1 - 1.0 K/uL   Eosinophils Relative 3 %   Eosinophils Absolute 0.2 0.0 - 0.5 K/uL   Basophils Relative 1 %   Basophils Absolute 0.1 0.0 - 0.1 K/uL   Immature Granulocytes 1 %   Abs Immature Granulocytes 0.06 0.00 - 0.07 K/uL    Comment: Performed at [facility] [address], [place], [place] [address]  Protime-INR     Status: None   Collection Time: 05/24/19 10:15 AM  Result Value Ref Range    Prothrombin Time 13.4 11.4 - 15.2 seconds   INR 1.0 0.8 - 1.2    Comment: (NOTE) INR goal varies based on device and disease states. Performed at [facility], [place] [address], [place], [place] [address]   Magnesium     Status: None   Collection Time: 05/24/19 10:15 AM  Result Value Ref Range   Magnesium 2.2 1.7 - 2.4 mg/dL    Comment: Performed at [place] [address], [place], [place] [address]  Hemoglobin A1c     Status: Abnormal   Collection Time: 05/24/19 10:15 AM  Result Value Ref Range   Hgb A1c MFr Bld 14.6 (H) 4.8 - 5.6 %    Comment: (NOTE) Pre diabetes:          5.7%-6.4% Diabetes:              >6.4% Glycemic control for   <7.0% adults with diabetes    Mean Plasma Glucose 372.32 mg/dL    Comment: Performed at [facility] [address], [place],  [address]  CBG monitoring, ED     Status: Abnormal   Collection Time: 05/24/19 10:22 AM  Result Value Ref Range   Glucose-Capillary 128 (H) 70 - 99 mg/dL   Comment 1 Notify RN    Comment 2 Document in Chart    Dg Tibia/fibula Right  Result Date: 05/24/2019 CLINICAL DATA:  Fall, ankle EXAM: RIGHT TIBIA AND FIBULA - 2 VIEW; RIGHT ANKLE - COMPLETE 3+ VIEW COMPARISON:  None. FINDINGS: There are oblique fractures of the distal fibula and tibia involving the diaphyses. Approximately 2 mm of medial displacement of the fibula. Approximately 7 mm medial displacement of the tibia with mild apex anterior angulation. Nondisplaced fracture through the medial malleolus. Partially imaged total right knee arthroplasty is unremarkable. IMPRESSION: Oblique, mildly displaced fractures of the distal right fibula and tibia. Nondisplaced horizontal fracture through the medial malleolus. Electronically Signed   By: Macy Mis M.D.   On: 05/24/2019 11:02   Dg Ankle Complete Right  Result Date: 05/24/2019 CLINICAL DATA:  Fall, ankle EXAM: RIGHT TIBIA AND FIBULA - 2 VIEW; RIGHT ANKLE - COMPLETE 3+ VIEW COMPARISON:   None. FINDINGS: There are oblique fractures of the distal fibula and tibia involving the diaphyses. Approximately 2 mm of medial displacement of the fibula. Approximately 7 mm medial displacement of the tibia with mild apex anterior  angulation. Nondisplaced fracture through the medial malleolus. Partially imaged total right knee arthroplasty is unremarkable. IMPRESSION: Oblique, mildly displaced fractures of the distal right fibula and tibia. Nondisplaced horizontal fracture through the medial malleolus. Electronically Signed   By: Guadlupe Spanish M.D.   On: 05/24/2019 11:02   Dg Foot 2 Views Right  Result Date: 05/24/2019 CLINICAL DATA:  Fall EXAM: RIGHT FOOT - 2 VIEW COMPARISON:  None. FINDINGS: Acute fractures of the tibia and fibula as described on separate radiographs. There is no acute fracture or dislocation involving the foot. Decreased osseous mineralization. IMPRESSION: No acute fracture or dislocation of the right foot. Electronically Signed   By: Guadlupe Spanish M.D.   On: 05/24/2019 11:05    Pending Labs Unresulted Labs (From admission, onward)    Start     Ordered   05/25/19 0500  Basic metabolic panel  Tomorrow morning,   R     05/24/19 1331   05/25/19 0500  CBC  Tomorrow morning,   R     05/24/19 1331   05/24/19 1209  SARS CORONAVIRUS 2 (TAT 6-24 HRS) Nasopharyngeal Nasopharyngeal Swab  (Symptomatic/High Risk of Exposure/Tier 1 Patients Labs with Precautions)  Once,   STAT    Question Answer Comment  Is this test for diagnosis or screening Screening   Symptomatic for COVID-19 as defined by CDC No   Hospitalized for COVID-19 No   Admitted to ICU for COVID-19 No   Previously tested for COVID-19 No   Resident in a congregate (group) care setting No   Employed in healthcare setting No   Pregnant No      05/24/19 1208          Vitals/Pain Today's Vitals   05/24/19 1130 05/24/19 1201 05/24/19 1221 05/24/19 1230  BP: (!) 116/58 98/66 (!) 105/51 (!) 119/58  Pulse:  (!) 51   (!) 36  Resp: 15 19 16 19   Temp:      TempSrc:      SpO2: 94% 95% 96% 96%  PainSc:        Isolation Precautions No active isolations  Medications Medications  insulin aspart (novoLOG) injection 0-15 Units (has no administration in time range)  heparin injection 5,000 Units (has no administration in time range)  acetaminophen (TYLENOL) tablet 650 mg (has no administration in time range)    Or  acetaminophen (TYLENOL) suppository 650 mg (has no administration in time range)  senna-docusate (Senokot-S) tablet 1 tablet (has no administration in time range)  promethazine (PHENERGAN) tablet 12.5 mg (has no administration in time range)  atorvastatin (LIPITOR) tablet 20 mg (has no administration in time range)  aspirin tablet 325 mg (has no administration in time range)  mometasone-formoterol (DULERA) 100-5 MCG/ACT inhaler 2 puff (has no administration in time range)  escitalopram (LEXAPRO) tablet 10 mg (has no administration in time range)  digoxin (LANOXIN) tablet 0.125 mg (has no administration in time range)  gabapentin (NEURONTIN) capsule 400 mg (has no administration in time range)  furosemide (LASIX) tablet 80 mg (has no administration in time range)  Insulin Glargine (1 Unit Dial) SOPN 70 Units (has no administration in time range)  HYDROmorphone (DILAUDID) injection 0.5 mg (has no administration in time range)  fentaNYL (SUBLIMAZE) injection 50 mcg (50 mcg Intravenous Given 05/24/19 1124)    Mobility non-ambulatory High fall risk   Focused Assessments    R Recommendations: See Admitting Provider Note  Report given to:   Additional Notes:

## 2019-05-24 NOTE — ED Notes (Signed)
Pure Wick was placed on patient. 

## 2019-05-24 NOTE — Anesthesia Preprocedure Evaluation (Addendum)
Anesthesia Evaluation  Patient identified by MRN, date of birth, ID band Patient awake    Reviewed: Allergy & Precautions, NPO status , Patient's Chart, lab work & pertinent test results  Airway Mallampati: II  TM Distance: >3 FB Neck ROM: Full    Dental no notable dental hx.    Pulmonary COPD,  COPD inhaler,  Symbicort,    Pulmonary exam normal breath sounds clear to auscultation       Cardiovascular hypertension, Pt. on medications +CHF  Normal cardiovascular exam+ dysrhythmias Atrial Fibrillation  Rhythm:Regular Rate:Normal  Last echo done March 2020 showed ef 40-45%, severely dilated left ventricle, mild mitral annular calcification, mild to moderate mitral regurgitation, mild global hypokinesis of left ventricle, ivc dilation.   PAF rate controlled with dig   Neuro/Psych PSYCHIATRIC DISORDERS Anxiety Depression negative neurological ROS     GI/Hepatic negative GI ROS, Neg liver ROS,   Endo/Other  diabetes, Type 2, Insulin DependentMorbid obesityBMI 40  Renal/GU CRFRenal diseaseLast Cr 1.33  negative genitourinary   Musculoskeletal  (+) Arthritis , Osteoarthritis,    Abdominal   Peds  Hematology negative hematology ROS (+)   Anesthesia Other Findings S/p fall while ambulating with walker- now with distal tib/fib fxs  Baseline very bradycardiac, occasionally to high 30s  Overall very deconditioned, obese  Reproductive/Obstetrics negative OB ROS                            Anesthesia Physical Anesthesia Plan  ASA: III  Anesthesia Plan: Regional and General   Post-op Pain Management: GA combined w/ Regional for post-op pain   Induction: Intravenous  PONV Risk Score and Plan: 2 and Ondansetron and Treatment may vary due to age or medical condition  Airway Management Planned: LMA  Additional Equipment: None  Intra-op Plan:   Post-operative Plan: Extubation in OR  Informed  Consent: I have reviewed the patients History and Physical, chart, labs and discussed the procedure including the risks, benefits and alternatives for the proposed anesthesia with the patient or authorized representative who has indicated his/her understanding and acceptance.     Dental advisory given  Plan Discussed with: CRNA  Anesthesia Plan Comments: (Anatomically very difficult regional nerve block, will place LMA for GA.)      Anesthesia Quick Evaluation

## 2019-05-25 ENCOUNTER — Inpatient Hospital Stay (HOSPITAL_COMMUNITY): Payer: Medicare Other | Admitting: Anesthesiology

## 2019-05-25 ENCOUNTER — Inpatient Hospital Stay (HOSPITAL_COMMUNITY): Payer: Medicare Other

## 2019-05-25 ENCOUNTER — Encounter (HOSPITAL_COMMUNITY)
Admission: EM | Disposition: A | Discharge status: Skilled Nursing Facility | Payer: Self-pay | Source: Home / Self Care | Attending: Student in an Organized Health Care Education/Training Program

## 2019-05-25 ENCOUNTER — Encounter (HOSPITAL_COMMUNITY): Payer: Self-pay

## 2019-05-25 DIAGNOSIS — Z794 Long term (current) use of insulin: Secondary | ICD-10-CM

## 2019-05-25 DIAGNOSIS — Z8719 Personal history of other diseases of the digestive system: Secondary | ICD-10-CM

## 2019-05-25 DIAGNOSIS — Z9181 History of falling: Secondary | ICD-10-CM

## 2019-05-25 DIAGNOSIS — W19XXXA Unspecified fall, initial encounter: Secondary | ICD-10-CM

## 2019-05-25 DIAGNOSIS — E1140 Type 2 diabetes mellitus with diabetic neuropathy, unspecified: Secondary | ICD-10-CM

## 2019-05-25 DIAGNOSIS — J449 Chronic obstructive pulmonary disease, unspecified: Secondary | ICD-10-CM

## 2019-05-25 DIAGNOSIS — I4821 Permanent atrial fibrillation: Secondary | ICD-10-CM

## 2019-05-25 DIAGNOSIS — I428 Other cardiomyopathies: Secondary | ICD-10-CM

## 2019-05-25 DIAGNOSIS — S82401A Unspecified fracture of shaft of right fibula, initial encounter for closed fracture: Secondary | ICD-10-CM

## 2019-05-25 DIAGNOSIS — I110 Hypertensive heart disease with heart failure: Secondary | ICD-10-CM

## 2019-05-25 DIAGNOSIS — M5136 Other intervertebral disc degeneration, lumbar region: Secondary | ICD-10-CM

## 2019-05-25 DIAGNOSIS — Z79899 Other long term (current) drug therapy: Secondary | ICD-10-CM

## 2019-05-25 DIAGNOSIS — B372 Candidiasis of skin and nail: Secondary | ICD-10-CM

## 2019-05-25 DIAGNOSIS — I5020 Unspecified systolic (congestive) heart failure: Secondary | ICD-10-CM

## 2019-05-25 DIAGNOSIS — S82201A Unspecified fracture of shaft of right tibia, initial encounter for closed fracture: Secondary | ICD-10-CM

## 2019-05-25 DIAGNOSIS — I340 Nonrheumatic mitral (valve) insufficiency: Secondary | ICD-10-CM

## 2019-05-25 HISTORY — PX: OPEN REDUCTION INTERNAL FIXATION (ORIF) TIBIA/FIBULA FRACTURE: SHX5992

## 2019-05-25 LAB — GLUCOSE, CAPILLARY
Glucose-Capillary: 239 mg/dL — ABNORMAL HIGH (ref 70–99)
Glucose-Capillary: 237 mg/dL — ABNORMAL HIGH (ref 70–99)
Glucose-Capillary: 188 mg/dL — ABNORMAL HIGH (ref 70–99)
Glucose-Capillary: 174 mg/dL — ABNORMAL HIGH (ref 70–99)
Glucose-Capillary: 191 mg/dL — ABNORMAL HIGH (ref 70–99)
Glucose-Capillary: 350 mg/dL — ABNORMAL HIGH (ref 70–99)

## 2019-05-25 LAB — BASIC METABOLIC PANEL
Anion gap: 9 (ref 5–15)
BUN: 41 mg/dL — ABNORMAL HIGH (ref 8–23)
CO2: 24 mmol/L (ref 22–32)
Calcium: 8.4 mg/dL — ABNORMAL LOW (ref 8.9–10.3)
Chloride: 98 mmol/L (ref 98–111)
Creatinine, Ser: 1.14 mg/dL — ABNORMAL HIGH (ref 0.44–1.00)
GFR calc Af Amer: 54 mL/min — ABNORMAL LOW (ref >60)
GFR calc non Af Amer: 47 mL/min — ABNORMAL LOW (ref >60)
Glucose, Bld: 319 mg/dL — ABNORMAL HIGH (ref 70–99)
Potassium: 4.3 mmol/L (ref 3.5–5.1)
Sodium: 131 mmol/L — ABNORMAL LOW (ref 135–145)

## 2019-05-25 LAB — CBC
HCT: 33.8 % — ABNORMAL LOW (ref 36.0–46.0)
Hemoglobin: 11.1 g/dL — ABNORMAL LOW (ref 12.0–15.0)
MCH: 31.4 pg (ref 26.0–34.0)
MCHC: 32.8 g/dL (ref 30.0–36.0)
MCV: 95.8 fL (ref 80.0–100.0)
Platelets: 199 10*3/uL (ref 150–400)
RBC: 3.53 MIL/uL — ABNORMAL LOW (ref 3.87–5.11)
RDW: 13.5 % (ref 11.5–15.5)
WBC: 7 10*3/uL (ref 4.0–10.5)
nRBC: 0 % (ref 0.0–0.2)

## 2019-05-25 LAB — VITAMIN D 25 HYDROXY (VIT D DEFICIENCY, FRACTURES): Vit D, 25-Hydroxy: 33.11 ng/mL (ref 30–100)

## 2019-05-25 LAB — MRSA PCR SCREENING: MRSA by PCR: POSITIVE — AB

## 2019-05-25 SURGERY — OPEN REDUCTION INTERNAL FIXATION (ORIF) TIBIA/FIBULA FRACTURE
Anesthesia: Regional | Laterality: Right

## 2019-05-25 MED ORDER — MIDAZOLAM HCL 2 MG/2ML IJ SOLN
INTRAMUSCULAR | Status: AC
  Filled 2019-05-25: qty 2

## 2019-05-25 MED ORDER — VANCOMYCIN HCL 1000 MG IV SOLR
INTRAVENOUS | Status: DC | PRN
  Administered 2019-05-25: 1000 mg via TOPICAL

## 2019-05-25 MED ORDER — FENTANYL CITRATE (PF) 250 MCG/5ML IJ SOLN
INTRAMUSCULAR | Status: AC
  Filled 2019-05-25: qty 5

## 2019-05-25 MED ORDER — ENOXAPARIN SODIUM 40 MG/0.4ML ~~LOC~~ SOLN
40.0000 mg | SUBCUTANEOUS | Status: DC
  Administered 2019-05-26 – 2019-05-30 (×5): 40 mg via SUBCUTANEOUS
  Filled 2019-05-25 (×5): qty 0.4

## 2019-05-25 MED ORDER — HYDROMORPHONE HCL 1 MG/ML IJ SOLN
0.5000 mg | INTRAMUSCULAR | Status: DC | PRN
  Administered 2019-05-26 – 2019-05-30 (×5): 1 mg via INTRAVENOUS
  Filled 2019-05-25 (×6): qty 1

## 2019-05-25 MED ORDER — FENTANYL CITRATE (PF) 100 MCG/2ML IJ SOLN
INTRAMUSCULAR | Status: AC
  Administered 2019-05-25: 100 ug via INTRAVENOUS
  Filled 2019-05-25: qty 2

## 2019-05-25 MED ORDER — EPHEDRINE SULFATE-NACL 50-0.9 MG/10ML-% IV SOSY
PREFILLED_SYRINGE | INTRAVENOUS | Status: DC | PRN
  Administered 2019-05-25 (×2): 10 mg via INTRAVENOUS

## 2019-05-25 MED ORDER — PHENYLEPHRINE HCL-NACL 10-0.9 MG/250ML-% IV SOLN
INTRAVENOUS | Status: DC | PRN
  Administered 2019-05-25: 15 ug/min via INTRAVENOUS

## 2019-05-25 MED ORDER — LACTATED RINGERS IV SOLN
INTRAVENOUS | Status: DC
  Administered 2019-05-25: 09:00:00 via INTRAVENOUS

## 2019-05-25 MED ORDER — ONDANSETRON HCL 4 MG/2ML IJ SOLN: 4.0000 mg | Freq: Once | INTRAMUSCULAR | Status: DC | PRN

## 2019-05-25 MED ORDER — ACETAMINOPHEN 500 MG PO TABS
1000.0000 mg | ORAL_TABLET | Freq: Four times a day (QID) | ORAL | Status: DC
  Administered 2019-05-25 – 2019-05-30 (×18): 1000 mg via ORAL
  Filled 2019-05-25 (×19): qty 2

## 2019-05-25 MED ORDER — ONDANSETRON HCL 4 MG/2ML IJ SOLN
INTRAMUSCULAR | Status: DC | PRN
  Administered 2019-05-25: 4 mg via INTRAVENOUS

## 2019-05-25 MED ORDER — 0.9 % SODIUM CHLORIDE (POUR BTL) OPTIME
TOPICAL | Status: DC | PRN
  Administered 2019-05-25: 1000 mL

## 2019-05-25 MED ORDER — VANCOMYCIN HCL 1000 MG IV SOLR
INTRAVENOUS | Status: AC
  Filled 2019-05-25: qty 1000

## 2019-05-25 MED ORDER — FENTANYL CITRATE (PF) 100 MCG/2ML IJ SOLN: 25.0000 ug | INTRAMUSCULAR | Status: DC | PRN

## 2019-05-25 MED ORDER — LIDOCAINE HCL (PF) 1 % IJ SOLN
INTRAMUSCULAR | Status: DC | PRN
  Administered 2019-05-25: 10 mL

## 2019-05-25 MED ORDER — ROPIVACAINE HCL 5 MG/ML IJ SOLN
INTRAMUSCULAR | Status: DC | PRN
  Administered 2019-05-25: 30 mL via PERINEURAL

## 2019-05-25 MED ORDER — CEFAZOLIN SODIUM-DEXTROSE 2-4 GM/100ML-% IV SOLN
2.0000 g | Freq: Three times a day (TID) | INTRAVENOUS | Status: AC
  Administered 2019-05-25 – 2019-05-26 (×3): 2 g via INTRAVENOUS
  Filled 2019-05-25 (×3): qty 100

## 2019-05-25 MED ORDER — ASPIRIN 81 MG PO CHEW
81.0000 mg | CHEWABLE_TABLET | Freq: Every day | ORAL | Status: DC
  Administered 2019-05-25 – 2019-05-30 (×6): 81 mg via ORAL
  Filled 2019-05-25 (×6): qty 1

## 2019-05-25 MED ORDER — ACETAMINOPHEN 500 MG PO TABS
1000.0000 mg | ORAL_TABLET | Freq: Once | ORAL | Status: AC
  Administered 2019-05-25: 09:00:00 1000 mg via ORAL
  Filled 2019-05-25: qty 2

## 2019-05-25 MED ORDER — TRAMADOL HCL 50 MG PO TABS
50.0000 mg | ORAL_TABLET | Freq: Four times a day (QID) | ORAL | Status: DC | PRN
  Administered 2019-05-26: 18:00:00 100 mg via ORAL
  Administered 2019-05-27 – 2019-05-29 (×3): 50 mg via ORAL
  Administered 2019-05-29: 100 mg via ORAL
  Administered 2019-05-30: 50 mg via ORAL
  Filled 2019-05-25: qty 2
  Filled 2019-05-25 (×2): qty 1
  Filled 2019-05-25: qty 2
  Filled 2019-05-25 (×2): qty 1

## 2019-05-25 MED ORDER — INSULIN GLARGINE 100 UNIT/ML ~~LOC~~ SOLN
40.0000 [IU] | Freq: Every day | SUBCUTANEOUS | Status: DC
  Administered 2019-05-25: 40 [IU] via SUBCUTANEOUS
  Filled 2019-05-25 (×2): qty 0.4

## 2019-05-25 MED ORDER — CHLORHEXIDINE GLUCONATE CLOTH 2 % EX PADS
6.0000 | MEDICATED_PAD | Freq: Every day | CUTANEOUS | Status: AC
  Administered 2019-05-25 – 2019-05-29 (×5): 6 via TOPICAL

## 2019-05-25 MED ORDER — FENTANYL CITRATE (PF) 100 MCG/2ML IJ SOLN
100.0000 ug | Freq: Once | INTRAMUSCULAR | Status: AC
  Administered 2019-05-25: 09:00:00 100 ug via INTRAVENOUS

## 2019-05-25 MED ORDER — INSULIN GLARGINE 100 UNIT/ML ~~LOC~~ SOLN
35.0000 [IU] | Freq: Every day | SUBCUTANEOUS | Status: DC
  Filled 2019-05-25: qty 0.35

## 2019-05-25 MED ORDER — MUPIROCIN 2 % EX OINT
1.0000 "application " | TOPICAL_OINTMENT | Freq: Two times a day (BID) | CUTANEOUS | Status: AC
  Administered 2019-05-25 – 2019-05-29 (×9): 1 via NASAL
  Filled 2019-05-25 (×3): qty 22

## 2019-05-25 MED ORDER — FENTANYL CITRATE (PF) 250 MCG/5ML IJ SOLN
INTRAMUSCULAR | Status: DC | PRN
  Administered 2019-05-25 (×2): 25 ug via INTRAVENOUS

## 2019-05-25 MED ORDER — DEXAMETHASONE SODIUM PHOSPHATE 10 MG/ML IJ SOLN
INTRAMUSCULAR | Status: DC | PRN
  Administered 2019-05-25: 10 mg via INTRAVENOUS

## 2019-05-25 MED ORDER — GABAPENTIN 100 MG PO CAPS
100.0000 mg | ORAL_CAPSULE | Freq: Three times a day (TID) | ORAL | Status: DC
  Administered 2019-05-25 – 2019-05-29 (×12): 100 mg via ORAL
  Filled 2019-05-25 (×12): qty 1

## 2019-05-25 MED ORDER — METHOCARBAMOL 500 MG PO TABS
500.0000 mg | ORAL_TABLET | Freq: Four times a day (QID) | ORAL | Status: DC | PRN
  Administered 2019-05-26 – 2019-05-30 (×7): 500 mg via ORAL
  Filled 2019-05-25 (×7): qty 1

## 2019-05-25 MED ORDER — PROPOFOL 10 MG/ML IV BOLUS
INTRAVENOUS | Status: DC | PRN
  Administered 2019-05-25: 100 mg via INTRAVENOUS

## 2019-05-25 SURGICAL SUPPLY — 70 items
BANDAGE ESMARK 6X9 LF (GAUZE/BANDAGES/DRESSINGS) ×1 IMPLANT
BIT DRILL 2.5X2.75 QC CALB (BIT) ×3 IMPLANT
BIT DRILL CALIBRATED 2.7 (BIT) ×2 IMPLANT
BIT DRILL CALIBRATED 2.7MM (BIT) ×1
BNDG COHESIVE 4X5 TAN STRL (GAUZE/BANDAGES/DRESSINGS) ×3 IMPLANT
BNDG ELASTIC 4X5.8 VLCR STR LF (GAUZE/BANDAGES/DRESSINGS) ×3 IMPLANT
BNDG ELASTIC 6X10 VLCR STRL LF (GAUZE/BANDAGES/DRESSINGS) ×3 IMPLANT
BNDG ELASTIC 6X5.8 VLCR STR LF (GAUZE/BANDAGES/DRESSINGS) IMPLANT
BNDG ESMARK 6X9 LF (GAUZE/BANDAGES/DRESSINGS) ×3
BRUSH SCRUB EZ PLAIN DRY (MISCELLANEOUS) ×6 IMPLANT
CHLORAPREP W/TINT 26 (MISCELLANEOUS) ×3 IMPLANT
COVER SURGICAL LIGHT HANDLE (MISCELLANEOUS) ×3 IMPLANT
COVER WAND RF STERILE (DRAPES) ×3 IMPLANT
DRAPE C-ARM 42X72 X-RAY (DRAPES) ×3 IMPLANT
DRAPE C-ARMOR (DRAPES) ×3 IMPLANT
DRAPE ORTHO SPLIT 77X108 STRL (DRAPES) ×4
DRAPE SURG ORHT 6 SPLT 77X108 (DRAPES) ×2 IMPLANT
DRAPE U-SHAPE 47X51 STRL (DRAPES) ×3 IMPLANT
DRSG ADAPTIC 3X8 NADH LF (GAUZE/BANDAGES/DRESSINGS) ×3 IMPLANT
ELECT REM PT RETURN 9FT ADLT (ELECTROSURGICAL) ×3
ELECTRODE REM PT RTRN 9FT ADLT (ELECTROSURGICAL) ×1 IMPLANT
GAUZE SPONGE 4X4 12PLY STRL (GAUZE/BANDAGES/DRESSINGS) ×3 IMPLANT
GLOVE BIO SURGEON STRL SZ 6.5 (GLOVE) ×6 IMPLANT
GLOVE BIO SURGEON STRL SZ7.5 (GLOVE) ×9 IMPLANT
GLOVE BIO SURGEONS STRL SZ 6.5 (GLOVE) ×3
GLOVE BIOGEL PI IND STRL 6.5 (GLOVE) ×1 IMPLANT
GLOVE BIOGEL PI IND STRL 7.5 (GLOVE) ×1 IMPLANT
GLOVE BIOGEL PI INDICATOR 6.5 (GLOVE) ×2
GLOVE BIOGEL PI INDICATOR 7.5 (GLOVE) ×2
GOWN STRL REUS W/ TWL LRG LVL3 (GOWN DISPOSABLE) ×2 IMPLANT
GOWN STRL REUS W/TWL LRG LVL3 (GOWN DISPOSABLE) ×4
K-WIRE ACE 1.6X6 (WIRE) ×3
KIT TURNOVER KIT B (KITS) ×3 IMPLANT
KWIRE ACE 1.6X6 (WIRE) ×1 IMPLANT
MANIFOLD NEPTUNE II (INSTRUMENTS) ×3 IMPLANT
NEEDLE HYPO 21X1.5 SAFETY (NEEDLE) IMPLANT
NEEDLE HYPO 25GX1X1/2 BEV (NEEDLE) ×3 IMPLANT
NS IRRIG 1000ML POUR BTL (IV SOLUTION) ×3 IMPLANT
PACK TOTAL JOINT (CUSTOM PROCEDURE TRAY) ×3 IMPLANT
PAD ARMBOARD 7.5X6 YLW CONV (MISCELLANEOUS) ×6 IMPLANT
PAD CAST 4YDX4 CTTN HI CHSV (CAST SUPPLIES) ×1 IMPLANT
PADDING CAST COTTON 4X4 STRL (CAST SUPPLIES) ×2
PADDING CAST COTTON 6X4 STRL (CAST SUPPLIES) IMPLANT
PLATE 12H 226 RT MED DIST TIB (Plate) ×3 IMPLANT
SCREW CORT FT 32X3.5XNONLOCK (Screw) ×4 IMPLANT
SCREW CORTICAL 3.5MM  28MM (Screw) ×2 IMPLANT
SCREW CORTICAL 3.5MM  32MM (Screw) ×8 IMPLANT
SCREW CORTICAL 3.5MM 28MM (Screw) ×1 IMPLANT
SCREW LOCK CORT STAR 3.5X10 (Screw) ×3 IMPLANT
SCREW LOCK CORT STAR 3.5X38 (Screw) ×3 IMPLANT
SCREW LOCK CORT STAR 3.5X40 (Screw) ×3 IMPLANT
SCREW LOCK CORT STAR 3.5X44 (Screw) ×6 IMPLANT
SCREW LOCK CORT STAR 3.5X46 (Screw) ×3 IMPLANT
SCREW LOCK CORT STAR 3.5X48 (Screw) ×3 IMPLANT
SCREW LP 3.5X44 (Screw) ×3 IMPLANT
SPONGE LAP 18X18 RF (DISPOSABLE) IMPLANT
STAPLER VISISTAT 35W (STAPLE) ×3 IMPLANT
SUCTION FRAZIER HANDLE 10FR (MISCELLANEOUS) ×2
SUCTION TUBE FRAZIER 10FR DISP (MISCELLANEOUS) ×1 IMPLANT
SUT ETHILON 3 0 PS 1 (SUTURE) ×3 IMPLANT
SUT PROLENE 0 CT (SUTURE) IMPLANT
SUT VIC AB 0 CT1 27 (SUTURE) ×2
SUT VIC AB 0 CT1 27XBRD ANBCTR (SUTURE) ×1 IMPLANT
SUT VIC AB 2-0 CT1 27 (SUTURE) ×4
SUT VIC AB 2-0 CT1 TAPERPNT 27 (SUTURE) ×2 IMPLANT
SYR CONTROL 10ML LL (SYRINGE) ×3 IMPLANT
TOWEL GREEN STERILE (TOWEL DISPOSABLE) ×6 IMPLANT
TOWEL GREEN STERILE FF (TOWEL DISPOSABLE) ×3 IMPLANT
UNDERPAD 30X30 (UNDERPADS AND DIAPERS) ×3 IMPLANT
WATER STERILE IRR 1000ML POUR (IV SOLUTION) ×3 IMPLANT

## 2019-05-25 NOTE — Anesthesia Procedure Notes (Signed)
Anesthesia Regional Block: Popliteal block   Pre-Anesthetic Checklist: ,, timeout performed, Correct Patient, Correct Site, Correct Laterality, Correct Procedure, Correct Position, site marked, Risks and benefits discussed,  Surgical consent,  Pre-op evaluation,  At surgeon's request and post-op pain management  Laterality: Right  Prep: Maximum Sterile Barrier Precautions used, chloraprep       Needles:  Injection technique: Single-shot  Needle Type: Echogenic Stimulator Needle     Needle Length: 9cm  Needle Gauge: 22     Additional Needles:   Procedures:,,,, ultrasound used (permanent image in chart),,,,  Narrative:  Start time: 05/25/2019 9:05 AM End time: 05/25/2019 9:18 AM Injection made incrementally with aspirations every 5 mL.  Performed by: Personally  Anesthesiologist: Pervis Hocking, DO  Additional Notes: Monitors applied. No increased pain on injection. No increased resistance to injection. Injection made in 5cc increments. Good needle visualization. Patient tolerated procedure well.

## 2019-05-25 NOTE — Progress Notes (Signed)
Subjective: Pt seen at the bedside today. Anticipating surgery this morning. States she has had multiple falls at home recently. No acute concerns.  Objective:  Vital signs in last 24 hours: Vitals:   05/25/19 0935 05/25/19 0940 05/25/19 0944 05/25/19 0945  BP: (!) 100/34 (!) 77/49  (!) 106/40  Pulse: (!) 46 (!) 48  (!) 48  Resp: 20 16    Temp:      TempSrc:      SpO2: 100% 99%  99%  Weight:   115.9 kg   Height:   5\' 7"  (1.702 m)    Physical Exam Vitals signs and nursing note reviewed.  Constitutional:      General: She is not in acute distress.    Appearance: She is obese.  Cardiovascular:     Rate and Rhythm: Normal rate. Rhythm irregular.     Heart sounds: Normal heart sounds.  Pulmonary:     Effort: Pulmonary effort is normal.  Musculoskeletal:     Right lower leg: No edema.     Left lower leg: No edema.     Comments: R leg in short leg splint  Skin:    General: Skin is warm and dry.  Neurological:     Mental Status: She is alert.    Assessment/Plan:  Active Problems:   Right tibial fracture  Ms. Bowmer is a 78 year old F with permanent A Fib not on anticoagulation, nonischemic cardiomyopathy, Type 2 Diabetes, COPD, HTN, and lumbar degeneration, who presented after a mechanical fall resulting in a right tibia and fibular fracture.    Right Tibia/Fibula Fracture Fracture after mechanical fall. Going to surgery today for open reduction internal fixation due to displacement and unstable nature of the injury. Revised cardiac risk index of 10.1% for 30-day risk of death and MI. R leg currently in splint and wrapped in ACE bandage. - Orthopedic following - will continue ASA 325mg  for DVT ppx post-operatively - pain management with Dilaudid 0.5mg  q6hrs prn   Mechanical Fall - resulting in fragility fracture Exact etiology of fall unknown, though likely multifactorial with gait instability and diabetic neuropathy. Does not appear to be cardiogenic with no  preceding symptoms. Fall from standing indicative of osteoporosis.  - PT/OT evaluation - pt will need follow-up for initiation of bisphosphonate therapy in a few months   Type 2 Diabetes Mellitus - uncontrolled A1c 14.6 here. States she takes Lantus 70u daily and Humalog 9u TID with meals at home. Was prescribed to take 14u TID, however was having hypoglycemic episodes with Glc in the 40s.  - reduced Lantus 35u qhs while NPO for surgery  - moderate SSI  - CBG monitoring - will need close Glc control post-operatively to optimize healing   Permanent Atrial Fibrillation  Rate controlled with digoxin. CHADS2-VASc score 6 with 9.7% stroke risk per year. Not on anticoagulation due to history of GI bleed.  - continue digoxin 129mcg every other day   Candidal intertrigo  On genital area, buttocks, and upper thighs - see photos in chart. - continue clotrimazole 1% cream BID  HFrEF Echo 09/2018 with EF 40-45%, severely dilated left ventricle, mild mitral annular calcification, mild to moderate mitral regurgitation, mild global hypokinesis of left ventricle, and IVC dilation.  - continue furosemide 80mg  PO daily - continue digoxin 162mcg every other day  HTN - holding home lisinopril    Diet - NPO Fluids - LR 5mL/hr DVT ppx - heparin 5,000 units subQ q8h CODE STATUS - FULL CODE  Dispo: Anticipated discharge pending functional status post operatively today. Suspect pt will need short-term rehab after discharge and prior to returning home.    Thom Chimes, MD 05/25/2019, 10:01 AM Pager: [phone]

## 2019-05-25 NOTE — Op Note (Signed)
Orthopaedic Surgery Operative Note (CSN: [id number] ) Date of Surgery: 05/25/2019  Admit Date: 05/24/2019   Diagnoses: Pre-Op Diagnoses: Right distal tibia/fibula fracture   Post-Op Diagnosis: Same  Procedures: 1. CPT 27758-Open reduction internal fixation of right distal tibia fracture 2. CPT 27781-Closed treatment of right distal fibular shaft fracture  Surgeons : Primary: Haddix, Gillie Manners, MD  Assistant: Ulyses Southward, PA-C  Location: OR 3   Anesthesia:General  Antibiotics: Ancef 2g preop   Tourniquet time:None  Estimated Blood Loss:25 mL  Complications:None  Specimens:None   Implants: Implant Name Type Inv. Item Serial No. Manufacturer Lot No. LRB No. Used Action  PLATE [id number] 258 RT MED DIST TIB - [id number] Plate PLATE 53I 144 RT MED DIST TIB  [employer](ORTH,TRAU,BIO,SG)  Right 1 Implanted  SCREW LOCK CORT STAR 3.5X44 - [id number] Screw SCREW LOCK CORT STAR 3.5X44  [employer](ORTH,TRAU,BIO,SG)  Right 2 Implanted  SCREW LOCK CORT STAR 3.5X46 - [id number] Screw SCREW LOCK CORT STAR 3.5X46  [employer](ORTH,TRAU,BIO,SG)  Right 1 Implanted  SCREW LOCK CORT STAR 3.5X10 - [id number] Screw SCREW LOCK CORT STAR 3.5X10  [employer](ORTH,TRAU,BIO,SG)  Right 1 Implanted  SCREW LOCK CORT STAR 3.5X38 - [id number] Screw SCREW LOCK CORT STAR 3.5X38  [employer](ORTH,TRAU,BIO,SG)  Right 1 Implanted  SCREW LOCK CORT STAR 3.5X40 - [id number] Screw SCREW LOCK CORT STAR 3.5X40  [employer](ORTH,TRAU,BIO,SG)  Right 1 Implanted     Indications for Surgery: 78 year old female who sustained a ground-level fall with a distal tibia fracture.  Due to the displacement and unstable nature of her injury I recommended proceeding with open reduction internal fixation.  Risks and benefits were discussed with the patient.  Risks included but not limited to bleeding, infection, nonunion, malunion, stiffness, posttraumatic arthritis, nerve and blood vessel injury, periprosthetic fracture, compartment  syndrome, even the possibility of anesthetic complications.  Patient agreed to proceed with surgery and consent was obtained.  Operative Findings: 1.  Open reduction internal fixation of right distal tibia fracture using [employer]immer [employer] ALPS medial distal tibial plate 2.  External rotation stress view after tibial fixation shows no syndesmotic widening and no instability of the mortise  Procedure: The patient was identified in the preoperative holding area. Consent was confirmed with the patient and their family and all questions were answered. The operative extremity was marked after confirmation with the patient. she was then brought back to the operating room by our anesthesia colleagues.  She was carefully transferred over to a radiolucent flat top table.  She was placed under general anesthetic.  A bump was placed in her operative hip. The operative extremity was then prepped and draped in usual sterile fashion. A preoperative timeout was performed to verify the patient, the procedure, and the extremity. Preoperative antibiotics were dosed.  Fluoroscopic images were obtained to show the unstable nature of the injury.  A small medial incision was then carried through skin and subcutaneous tissue.  The neurovascular bundle was protected throughout the case.  I then mobilized the skin flap to be able to perform a minimally invasive approach.  I used a Cobb elevator to clear a path along the medial border of the proximal tibial shaft.  Reduction maneuver was performed and then I chose a 12 hole distal medial Alps Zimmer [employer]omet plate and slid this subcutaneously along the length of the tibial shaft.  I held it provisionally distally with a K wire.  It lined up appropriately on the lateral view proximally.  I placed a nonlocking screw in the distal  segment.  This brought the plate flush to bone.  I then placed a percutaneous incision through the tibial shaft to bring the plate flush to the shaft and I reduce  the fracture further.  I then returned to the distal segment and placed locking screws in the distal segment.  I returned to the tibial shaft and placed on nonlocking screws percutaneously along the length of the tibial shaft completing the construct.  Finally the nonlocking screw that was placed in the distal segment was changed out to a locking screw.  Final fluoroscopic images were obtained.  An external rotation stress view was obtained of the ankle to show that the fibula fracture was stable and did not need any fixation.  The incision was copiously irrigated.  A gram of vancomycin powder was placed into the incision.  The skin was closed with 2-0 Vicryl and 3-0 nylon.  A sterile dressing consisting of bacitracin ointment, Adaptic, 4 x 4's and sterile cast padding with an Ace wrap was placed.  Patient was awoken from anesthesia and taken the PACU in stable condition.  Post Op Plan/Instructions: Patient will be nonweightbearing to the right lower extremity.  She will be transition to a PRAFO boot.  She will receive postoperative Ancef.  I would recommend Lovenox for DVT prophylaxis.  We will mobilize her physical therapy and she will likely need a skilled nursing facility stay.  I was present and performed the entire surgery.  Patrecia Pace, PA-C did assist me throughout the case. An assistant was necessary given the difficulty in approach, maintenance of reduction and ability to instrument the fracture.   Katha Hamming, MD [facility]

## 2019-05-25 NOTE — Transfer of Care (Signed)
Immediate Anesthesia Transfer of Care Note  Patient: Carly Rosario  Procedure(s) Performed: OPEN REDUCTION INTERNAL FIXATION TIBIA/FIBULA FRACTURE (Right )  Patient Location: PACU  Anesthesia Type:General and Regional  Level of Consciousness: awake, alert  and oriented  Airway & Oxygen Therapy: Patient Spontanous Breathing and Patient connected to face mask oxygen  Post-op Assessment: Report given to RN and Post -op Vital signs reviewed and stable  Post vital signs: Reviewed and stable  Last Vitals:  Vitals Value Taken Time  BP 118/48 05/25/19 1122  Temp 36.1 C 05/25/19 1120  Pulse 61 05/25/19 1125  Resp 15 05/25/19 1125  SpO2 100 % 05/25/19 1125  Vitals shown include unvalidated device data.  Last Pain:  Vitals:   05/25/19 1120  TempSrc:   PainSc: Asleep      Patients Stated Pain Goal: 0 (09/30/92 5859)  Complications: No apparent anesthesia complications

## 2019-05-25 NOTE — Progress Notes (Signed)
PT Cancellation Note  Patient Details Name: Carly Rosario MRN: [id number] DOB: [birth year]   Cancelled Treatment:    Reason Eval/Treat Not Completed: Patient at procedure or test/unavailable. Pt being transported to OR upon arrival. PT will continue to follow up with pt acutely as available.    [place] 05/25/2019, 8:32 AM

## 2019-05-25 NOTE — Progress Notes (Signed)
OT Cancellation Note  Patient Details Name: Carly Rosario MRN: [id number] DOB: May 31, 1941   Cancelled Treatment:    Reason Eval/Treat Not Completed: Patient at procedure or test/ unavailable. Pt off unit at OR, will check back tomorrow  Britt Bottom 05/25/2019, 9:43 AM

## 2019-05-25 NOTE — Plan of Care (Signed)
  Problem: Education: Goal: Knowledge of General Education information will improve Description Including pain rating scale, medication(s)/side effects and non-pharmacologic comfort measures Outcome: Progressing   

## 2019-05-25 NOTE — Progress Notes (Signed)
Ortho Trauma Note  Please see my attestation to Legrand Como Jeffrey's note from yesterday.  Plan to perform open reduction internal fixation.  Risks and benefits discussed.  Patient agrees to proceed with surgery and consent was obtained.  Shona Needles, MD [facility] [phone] (office) [email or web address]

## 2019-05-25 NOTE — Progress Notes (Signed)
Dr. Doroteo Glassman notified about patient's soft SBPs in 60s-70s. No further orders/ interventions per Dr. Doroteo Glassman. Patient is A&Ox4, denies CP, diziness, or discomfort. No acute distress noted.

## 2019-05-25 NOTE — Anesthesia Procedure Notes (Signed)
Anesthesia Regional Block: Adductor canal block   Pre-Anesthetic Checklist: ,, timeout performed, Correct Patient, Correct Site, Correct Laterality, Correct Procedure, Correct Position, site marked, Risks and benefits discussed,  Surgical consent,  Pre-op evaluation,  At surgeon's request and post-op pain management  Laterality: Right  Prep: Maximum Sterile Barrier Precautions used, chloraprep       Needles:  Injection technique: Single-shot  Needle Type: Echogenic Stimulator Needle     Needle Length: 9cm  Needle Gauge: 22     Additional Needles:   Procedures:,,,, ultrasound used (permanent image in chart),,,,  Narrative:  Start time: 05/25/2019 9:00 AM End time: 05/25/2019 9:05 AM Injection made incrementally with aspirations every 5 mL.  Performed by: Personally  Anesthesiologist: Pervis Hocking, DO  Additional Notes: Monitors applied. No increased pain on injection. No increased resistance to injection. Injection made in 5cc increments. Good needle visualization. Patient tolerated procedure well.

## 2019-05-25 NOTE — Progress Notes (Signed)
Orthopedic Tech Progress Note Patient Details:  Carly Rosario 15-Mar-1941 [id number]  Ortho Devices Type of Ortho Device: Prafo boot/shoe Ortho Device/Splint Location: RLE Ortho Device/Splint Interventions: Ordered, Application   Post Interventions Patient Tolerated: Well Instructions Provided: Care of device   Braulio Bosch 05/25/2019, 1:40 PM

## 2019-05-25 NOTE — Anesthesia Procedure Notes (Signed)
Procedure Name: LMA Insertion Date/Time: 05/25/2019 10:03 AM Performed by: Clearnce Sorrel, CRNA Pre-anesthesia Checklist: Patient identified, Emergency Drugs available, Suction available, Patient being monitored and Timeout performed Patient Re-evaluated:Patient Re-evaluated prior to induction Oxygen Delivery Method: Circle system utilized Preoxygenation: Pre-oxygenation with 100% oxygen Induction Type: IV induction LMA: LMA inserted LMA Size: 4.0 Number of attempts: 1 Placement Confirmation: positive ETCO2 and breath sounds checked- equal and bilateral Tube secured with: Tape Dental Injury: Teeth and Oropharynx as per pre-operative assessment

## 2019-05-26 DIAGNOSIS — E1165 Type 2 diabetes mellitus with hyperglycemia: Secondary | ICD-10-CM

## 2019-05-26 LAB — CBC
HCT: 34.5 % — ABNORMAL LOW (ref 36.0–46.0)
Hemoglobin: 11.2 g/dL — ABNORMAL LOW (ref 12.0–15.0)
MCH: 30.9 pg (ref 26.0–34.0)
MCHC: 32.5 g/dL (ref 30.0–36.0)
MCV: 95.3 fL (ref 80.0–100.0)
Platelets: 194 10*3/uL (ref 150–400)
RBC: 3.62 MIL/uL — ABNORMAL LOW (ref 3.87–5.11)
RDW: 13.2 % (ref 11.5–15.5)
WBC: 8.2 10*3/uL (ref 4.0–10.5)
nRBC: 0 % (ref 0.0–0.2)

## 2019-05-26 LAB — BASIC METABOLIC PANEL
Anion gap: 9 (ref 5–15)
Anion gap: 11 (ref 5–15)
Anion gap: 10 (ref 5–15)
BUN: 40 mg/dL — ABNORMAL HIGH (ref 8–23)
BUN: 40 mg/dL — ABNORMAL HIGH (ref 8–23)
BUN: 42 mg/dL — ABNORMAL HIGH (ref 8–23)
CO2: 23 mmol/L (ref 22–32)
CO2: 23 mmol/L (ref 22–32)
CO2: 23 mmol/L (ref 22–32)
Calcium: 8.9 mg/dL (ref 8.9–10.3)
Calcium: 8.9 mg/dL (ref 8.9–10.3)
Calcium: 8.8 mg/dL — ABNORMAL LOW (ref 8.9–10.3)
Chloride: 98 mmol/L (ref 98–111)
Chloride: 96 mmol/L — ABNORMAL LOW (ref 98–111)
Chloride: 96 mmol/L — ABNORMAL LOW (ref 98–111)
Creatinine, Ser: 1.2 mg/dL — ABNORMAL HIGH (ref 0.44–1.00)
Creatinine, Ser: 1.28 mg/dL — ABNORMAL HIGH (ref 0.44–1.00)
Creatinine, Ser: 1.2 mg/dL — ABNORMAL HIGH (ref 0.44–1.00)
GFR calc Af Amer: 51 mL/min — ABNORMAL LOW (ref >60)
GFR calc Af Amer: 47 mL/min — ABNORMAL LOW (ref >60)
GFR calc Af Amer: 51 mL/min — ABNORMAL LOW (ref >60)
GFR calc non Af Amer: 44 mL/min — ABNORMAL LOW (ref >60)
GFR calc non Af Amer: 41 mL/min — ABNORMAL LOW (ref >60)
GFR calc non Af Amer: 44 mL/min — ABNORMAL LOW (ref >60)
Glucose, Bld: 362 mg/dL — ABNORMAL HIGH (ref 70–99)
Glucose, Bld: 350 mg/dL — ABNORMAL HIGH (ref 70–99)
Glucose, Bld: 259 mg/dL — ABNORMAL HIGH (ref 70–99)
Potassium: 5.7 mmol/L — ABNORMAL HIGH (ref 3.5–5.1)
Potassium: 5.2 mmol/L — ABNORMAL HIGH (ref 3.5–5.1)
Potassium: 4.4 mmol/L (ref 3.5–5.1)
Sodium: 130 mmol/L — ABNORMAL LOW (ref 135–145)
Sodium: 130 mmol/L — ABNORMAL LOW (ref 135–145)
Sodium: 129 mmol/L — ABNORMAL LOW (ref 135–145)

## 2019-05-26 LAB — GLUCOSE, CAPILLARY
Glucose-Capillary: 335 mg/dL — ABNORMAL HIGH (ref 70–99)
Glucose-Capillary: 303 mg/dL — ABNORMAL HIGH (ref 70–99)
Glucose-Capillary: 339 mg/dL — ABNORMAL HIGH (ref 70–99)
Glucose-Capillary: 155 mg/dL — ABNORMAL HIGH (ref 70–99)

## 2019-05-26 MED ORDER — INSULIN GLARGINE 100 UNIT/ML ~~LOC~~ SOLN
60.0000 [IU] | Freq: Every day | SUBCUTANEOUS | Status: DC
  Administered 2019-05-26: 60 [IU] via SUBCUTANEOUS
  Filled 2019-05-26 (×2): qty 0.6

## 2019-05-26 MED ORDER — INSULIN ASPART 100 UNIT/ML ~~LOC~~ SOLN
5.0000 [IU] | Freq: Three times a day (TID) | SUBCUTANEOUS | Status: DC
  Administered 2019-05-26 (×3): 5 [IU] via SUBCUTANEOUS

## 2019-05-26 NOTE — Progress Notes (Signed)
Bariatric BSC is ordered.

## 2019-05-26 NOTE — Evaluation (Signed)
Physical Therapy Evaluation Patient Details Name: Carly Rosario MRN: [id number] DOB: 1941-05-30 Today's Date: 05/26/2019   History of Present Illness  Pt is a 78 y/o female s/p R distal tib/fib fx ORIF secondary to a fall at home. PMH including but not limited to CHF, COPD, HTN and DM.    Clinical Impression  Pt presented supine in bed with HOB elevated, awake and willing to participate in therapy session. Prior to admission, pt reported that she ambulated with use of RW and independent with ADLs/IADLs. Pt lives alone but has family available to assist her as needed. At the time of evaluation, pt greatly limited secondary to generalized weakness and pain. She was able to perform bed mobility with supervision and transfers with min-mod A x2 with eventual use of the STEDY to transfer to chair. Pt would continue to benefit from skilled physical therapy services at this time while admitted and after d/c to address the below listed limitations in order to improve overall safety and independence with functional mobility.     Follow Up Recommendations SNF    Equipment Recommendations  None recommended by PT    Recommendations for Other Services       Precautions / Restrictions Precautions Precautions: Fall Restrictions Weight Bearing Restrictions: Yes RLE Weight Bearing: Non weight bearing      Mobility  Bed Mobility Overal bed mobility: Needs Assistance Bed Mobility: Supine to Sit     Supine to sit: Supervision     General bed mobility comments: supervision for safety; pt achieving sitting position towards her R side  Transfers Overall transfer level: Needs assistance Equipment used: Rolling walker (2 wheeled) Transfers: Sit to/from [facility] Sit to Stand: Mod assist;+2 physical assistance;+2 safety/equipment;Min assist Stand pivot transfers: Mod assist;+2 physical assistance;+2 safety/equipment       General transfer comment: pt able to stand from EOB  with mod A x2 to power into standing and for stability; pt with difficulty with the pivot part of the transfer; however, pt only getting about halfway to chair and then needing to sit back down on the side of the bed. The STEDY was then used to transfer pt from bed to chair with min A x2 for safety  Ambulation/Gait             General Gait Details: unable  Stairs            Wheelchair Mobility    Modified Rankin (Stroke Patients Only)       Balance Overall balance assessment: Needs assistance;History of Falls Sitting-balance support: Feet supported Sitting balance-Leahy Scale: Fair     Standing balance support: Bilateral upper extremity supported Standing balance-Leahy Scale: Poor Standing balance comment: reliant on bilateral UEs and external support                             Pertinent Vitals/Pain Pain Assessment: Faces Faces Pain Scale: Hurts little more Pain Location: R ankle/foot Pain Descriptors / Indicators: Guarding Pain Intervention(s): Monitored during session;Repositioned    Home Living Family/patient expects to be discharged to:: Private residence Living Arrangements: Alone Available Help at Discharge: Family;Friend(s);Available PRN/intermittently Type of Home: House Home Access: Ramped entrance     Home Layout: One level Home Equipment: Cane - single point;Walker - 2 wheels;Wheelchair - manual;Shower seat;Grab bars - toilet Additional Comments: does not have throw rugs, fell going from her kitchen into the bedroom and reports she "got tripped up"    Prior  Function Level of Independence: Independent with assistive device(s)         Comments: ambulates with RW, drives, does her own grocery shopping     Hand Dominance        Extremity/Trunk Assessment   Upper Extremity Assessment Upper Extremity Assessment: Defer to OT evaluation;Generalized weakness    Lower Extremity Assessment Lower Extremity Assessment: RLE  deficits/detail RLE Deficits / Details: pt with decreased strength and ROM limitations secondary to post-op pain and weakness; pt able to maintain NWB R LE throughout independently       Communication   Communication: No difficulties  Cognition Arousal/Alertness: Awake/alert Behavior During Therapy: WFL for tasks assessed/performed Overall Cognitive Status: Within Functional Limits for tasks assessed                                        General Comments      Exercises     Assessment/Plan    PT Assessment Patient needs continued PT services  PT Problem List Decreased strength;Decreased range of motion;Decreased activity tolerance;Decreased mobility;Decreased balance;Decreased coordination;Decreased knowledge of use of DME;Decreased safety awareness;Decreased knowledge of precautions;Pain       PT Treatment Interventions DME instruction;Gait training;Stair training;Functional mobility training;Therapeutic activities;Therapeutic exercise;Neuromuscular re-education;Balance training;Patient/family education    PT Goals (Current goals can be found in the Care Plan section)  Acute Rehab PT Goals Patient Stated Goal: to get stronger PT Goal Formulation: With patient Time For Goal Achievement: 06/09/19 Potential to Achieve Goals: Good    Frequency Min 3X/week   Barriers to discharge        Co-evaluation PT/OT/SLP Co-Evaluation/Treatment: Yes Reason for Co-Treatment: To address functional/ADL transfers;For patient/therapist safety PT goals addressed during session: Mobility/safety with mobility;Balance;Proper use of DME;Strengthening/ROM         AM-PAC PT "6 Clicks" Mobility  Outcome Measure Help needed turning from your back to your side while in a flat bed without using bedrails?: None Help needed moving from lying on your back to sitting on the side of a flat bed without using bedrails?: None Help needed moving to and from a bed to a chair (including a  wheelchair)?: Total Help needed standing up from a chair using your arms (e.g., wheelchair or bedside chair)?: A Lot Help needed to walk in hospital room?: Total Help needed climbing 3-5 steps with a railing? : Total 6 Click Score: 13    End of Session Equipment Utilized During Treatment: Gait belt Activity Tolerance: Patient tolerated treatment well Patient left: in chair;with call bell/phone within reach Nurse Communication: Mobility status;Need for lift equipment PT Visit Diagnosis: Other abnormalities of gait and mobility (R26.89)    Time: 9622-2979 PT Time Calculation (min) (ACUTE ONLY): 25 min   Charges:   PT Evaluation $PT Eval Moderate Complexity: 1 Mod          Ginette Pitman, PT, DPT  Acute Rehabilitation Services Pager [phone] Office [phone]    Carly Rosario Carly Rosario 05/26/2019, 10:15 AM

## 2019-05-26 NOTE — Progress Notes (Signed)
Orthopaedic Trauma Progress Note  S: Sleeping this AM. Appears comfortable  O:  Vitals:   05/26/19 0102 05/26/19 0317  BP: (!) 128/52 (!) 130/49  Pulse: (!) 54 (!) 47  Resp: 17 17  Temp: 98.6 F (37 C) 98.2 F (36.8 C)  SpO2: 95% 91%    NAD RLE: PRAFO in place, dressing clean, dry and intact. Compartments soft and compressible. Patient sleeping so unable to assess neuroexam. Warm and well perfused foot.  Imaging: Stable postop imaging  Labs:  Results for orders placed or performed during the hospital encounter of 05/24/19 (from the past 24 hour(s))  Glucose, capillary     Status: Abnormal   Collection Time: 05/25/19  8:44 AM  Result Value Ref Range   Glucose-Capillary 237 (H) 70 - 99 mg/dL  Glucose, capillary     Status: Abnormal   Collection Time: 05/25/19 11:22 AM  Result Value Ref Range   Glucose-Capillary 188 (H) 70 - 99 mg/dL   Comment 1 Notify RN    Comment 2 Document in Chart   Glucose, capillary     Status: Abnormal   Collection Time: 05/25/19 12:26 PM  Result Value Ref Range   Glucose-Capillary 174 (H) 70 - 99 mg/dL  VITAMIN D 25 Hydroxy (Vit-D Deficiency, Fractures)     Status: None   Collection Time: 05/25/19  2:19 PM  Result Value Ref Range   Vit D, 25-Hydroxy 33.11 30 - 100 ng/mL  Glucose, capillary     Status: Abnormal   Collection Time: 05/25/19  4:24 PM  Result Value Ref Range   Glucose-Capillary 191 (H) 70 - 99 mg/dL  Glucose, capillary     Status: Abnormal   Collection Time: 05/25/19  9:41 PM  Result Value Ref Range   Glucose-Capillary 350 (H) 70 - 99 mg/dL  AM CBC     Status: Abnormal   Collection Time: 05/26/19  3:29 AM  Result Value Ref Range   WBC 8.2 4.0 - 10.5 K/uL   RBC 3.62 (L) 3.87 - 5.11 MIL/uL   Hemoglobin 11.2 (L) 12.0 - 15.0 g/dL   HCT 47.6 (L) 54.6 - 50.3 %   MCV 95.3 80.0 - 100.0 fL   MCH 30.9 26.0 - 34.0 pg   MCHC 32.5 30.0 - 36.0 g/dL   RDW 54.6 56.8 - 12.7 %   Platelets 194 150 - 400 K/uL   nRBC 0.0 0.0 - 0.2 %  Basic  metabolic panel     Status: Abnormal   Collection Time: 05/26/19  3:29 AM  Result Value Ref Range   Sodium 130 (L) 135 - 145 mmol/L   Potassium 5.7 (H) 3.5 - 5.1 mmol/L   Chloride 98 98 - 111 mmol/L   CO2 23 22 - 32 mmol/L   Glucose, Bld 362 (H) 70 - 99 mg/dL   BUN 40 (H) 8 - 23 mg/dL   Creatinine, Ser 5.17 (H) 0.44 - 1.00 mg/dL   Calcium 8.9 8.9 - 00.1 mg/dL   GFR calc non Af Amer 44 (L) >60 mL/min   GFR calc Af Amer 51 (L) >60 mL/min   Anion gap 9 5 - 15  Glucose, capillary     Status: Abnormal   Collection Time: 05/26/19  6:43 AM  Result Value Ref Range   Glucose-Capillary 335 (H) 70 - 99 mg/dL  Basic metabolic panel     Status: Abnormal   Collection Time: 05/26/19  6:52 AM  Result Value Ref Range   Sodium 130 (L) 135 -  145 mmol/L   Potassium 5.2 (H) 3.5 - 5.1 mmol/L   Chloride 96 (L) 98 - 111 mmol/L   CO2 23 22 - 32 mmol/L   Glucose, Bld 350 (H) 70 - 99 mg/dL   BUN 40 (H) 8 - 23 mg/dL   Creatinine, Ser 1.28 (H) 0.44 - 1.00 mg/dL   Calcium 8.9 8.9 - 10.3 mg/dL   GFR calc non Af Amer 41 (L) >60 mL/min   GFR calc Af Amer 47 (L) >60 mL/min   Anion gap 11 5 - 15    Assessment: 78 year old F s/p fall  Injuries: Right distal tibia fracture  Weightbearing: NWB RLE, okay to work on knee and ankle ROM as tolerated  Insicional and dressing care: Dressing in place until POD 2 or 3  Orthopedic device(s):PRAFO to wear in bed and when up with therapy  CV/Blood loss: Hgb this AM 11.2, stable from yesterday  Pain management: 1. Tylenol 1000mg  q 6 hours 2. Dilaudid 0.5-1mg  q3 hours PRN 3. Robaxin 500 mg q 6 hours PRN 4. Tramadol 50-100 mg q 6 hours PRN  VTE prophylaxis: Lovenox 40 mg daily to start today  ID: Ancef 2 gm postop for 24 hours  Medical co-morbidities: 1. T2DM-Current sugars are in 300s, will need much better control of this to decrease risk of postoperative infection and complications 2. CHF/COPD-stable, per primary team  Impediments to Fracture Healing: Obesity  and glycemic control  Dispo: PT/OT pending, will need SNF  Follow - up plan: 2-3 weeks post discharge in office with me for x-rays and suture removal  Shona Needles, MD [facility] [phone] (office) [email or web address]

## 2019-05-26 NOTE — Progress Notes (Signed)
   Subjective: Pt seen at the bedside this morning eating breakfast. Doing well. Denies any pain, and has no acute concerns.  Objective:  Vital signs in last 24 hours: Vitals:   05/25/19 2033 05/26/19 0102 05/26/19 0317 05/26/19 0600  BP: 114/79 (!) 128/52 (!) 130/49   Pulse: 62 (!) 54 (!) 47   Resp:  17 17   Temp: 97.8 F (36.6 C) 98.6 F (37 C) 98.2 F (36.8 C)   TempSrc: Oral Oral Oral   SpO2: 96% 95% 91%   Weight:    118 kg  Height:       Physical Exam Vitals signs and nursing note reviewed.  Constitutional:      General: She is not in acute distress.    Appearance: She is obese. She is not ill-appearing.  Pulmonary:     Effort: Pulmonary effort is normal.  Musculoskeletal:     Comments: R leg in boot.  Skin:    General: Skin is warm and dry.  Neurological:     Mental Status: She is alert.     Comments: RLE sensation and motor grossly intact    Assessment/Plan:  Principal Problem:   Right tibial fracture Active Problems:   Morbid obesity with BMI of 40.0-44.9, adult (HCC)   Chronic atrial fibrillation (HCC)   Physical debility   Type 2 diabetes mellitus with hypercholesterolemia ([place])   Carly Rosario is a 78 year old F with permanent A Fib not on anticoagulation,nonischemic cardiomyopathy,Type 2 Diabetes, COPD, HTN,and lumbar degeneration, who presented after a mechanical fall resulting in a right tibia and fibular fracture .    Right Tibia/Fibula Fracture - Post-op day 1 Fracture after mechanical fall. S/p open reduction internal fixation of R distal tibia and closed treatment of R distal fibular fractures on 11/6 - Orthopedic following   - pt non weight bearing in RLE in PRAFO boot  - enoxaparin for DVT ppx post-operatively  - mobilize with PT - pain management with tramadol 50-100mg  q6h PRN and Dilaudid 0.5-1mg  q3hrs PRN for breakthrough  Mechanical Fall - resulting in fragility fracture Fall likely multifactorial with gait instability and  diabetic neuropathy. No preceding symptoms concerning for cardiogenic cause. Fall from standing indicative of osteoporosis.  - PT/OT evaluation - follow-up with PCP for initiation of bisphosphonate therapy in a few months  Type 2 Diabetes Mellitus - uncontrolled A1c 14.6 here. Was taking Lantus 70u daily and Humalog 9u TID with meals at home.  - increased Lantus to 60u qhs - add novolog 5 units TID with meals - moderate SSI  - CBG monitoring - will need close Glc control post-operatively to optimize healing  Permanent Atrial Fibrillation - stable Rate controlled with digoxin. CHADS2-VASc score 6, not on anticoagulation due to history of GI bleed.  - continue digoxin 131mcg every other day - discontinue cardiac monitoring  Candidal intertrigo  - continue clotrimazole 1% cream BID  HFrEF Well compensated on exam - continue furosemide 80mg  PO daily - continue digoxin 142mcg every other day  HTN - holding home lisinopril  Diet - carb modified Fluids - none DVT ppx - enoxaparin 40mg  subQ daily CODE STATUS - FULL CODE  Dispo: Anticipated discharge pending functional status with post-operative PT. Suspect pt will need short-term rehab after discharge and prior to returning home.    Carly Horns, MD 05/26/2019, 6:24 AM Pager: [phone]

## 2019-05-26 NOTE — Progress Notes (Signed)
Occupational Therapy Evaluation Patient Details Name: Carly Rosario MRN: [id number] DOB: Jan 31, 1941 Today's Date: 05/26/2019    History of Present Illness Pt is a 78 y/o female s/p R distal tib/fib fx ORIF secondary to a fall at home. PMH including but not limited to CHF, COPD, HTN and DM.   Clinical Impression   PTA, pt was living at home alone with family available to assist prn, she reports she was independent with ADL/IADL and functional mobility with use of RW. Pt currently requires supervision for bed mobility, modA+2 with use of stedy to transfer from EOB to recliner. She requires modA-maxA for LB dressing. Due to decline in current level of function, pt would benefit from acute OT to address established goals to facilitate safe D/C to venue listed below. At this time, recommend SNF follow-up. Will continue to follow acutely.     Follow Up Recommendations  SNF    Equipment Recommendations  3 in 1 bedside commode    Recommendations for Other Services       Precautions / Restrictions Precautions Precautions: Fall Restrictions Weight Bearing Restrictions: Yes RLE Weight Bearing: Non weight bearing      Mobility Bed Mobility Overal bed mobility: Needs Assistance Bed Mobility: Supine to Sit     Supine to sit: Supervision     General bed mobility comments: supervision for safety; pt achieving sitting position towards her R side  Transfers Overall transfer level: Needs assistance Equipment used: Rolling walker (2 wheeled) Transfers: Sit to/from [facility] Sit to Stand: Mod assist;+2 physical assistance;+2 safety/equipment;Min assist Stand pivot transfers: Mod assist;+2 physical assistance;+2 safety/equipment       General transfer comment: pt able to stand from EOB with mod A x2 to power into standing and for stability; pt with difficulty with the pivot part of the transfer; however, pt only getting about halfway to chair and then needing to sit back  down on the side of the bed. The STEDY was then used to transfer pt from bed to chair with min A x2 for safety    Balance Overall balance assessment: Needs assistance;History of Falls Sitting-balance support: Feet supported Sitting balance-Leahy Scale: Fair     Standing balance support: Bilateral upper extremity supported Standing balance-Leahy Scale: Poor Standing balance comment: reliant on bilateral UEs and external support                           ADL either performed or assessed with clinical judgement   ADL Overall ADL's : Needs assistance/impaired Eating/Feeding: Set up;Sitting   Grooming: Set up;Sitting   Upper Body Bathing: Min guard;Sitting   Lower Body Bathing: Moderate assistance;+2 for physical assistance;Sit to/from stand   Upper Body Dressing : Min guard;Sitting   Lower Body Dressing: Moderate assistance;+2 for physical assistance;Sit to/from stand   Toilet Transfer: Stand-pivot(stedy) Armed forces technical officer Details (indicate cue type and reason): simulated transfer from EOB to recliner with use of sara stedy Toileting- Clothing Manipulation and Hygiene: Moderate assistance;+2 for physical assistance       Functional mobility during ADLs: Moderate assistance;+2 for physical assistance(sara stedy) General ADL Comments: pt with decreased balance, strength, and activity tolerance impacting safety and independence with ADL     Vision Baseline Vision/History: No visual deficits Patient Visual Report: (reports retinal surgery 2 weeks ago)       Perception     Praxis      Pertinent Vitals/Pain Pain Assessment: Faces Faces Pain Scale: Hurts little more Pain  Location: R ankle/foot Pain Descriptors / Indicators: Guarding Pain Intervention(s): Monitored during session     Hand Dominance Right   Extremity/Trunk Assessment Upper Extremity Assessment Upper Extremity Assessment: RUE deficits/detail;Generalized weakness RUE Deficits / Details: pt  reports arthritis in R shoulder   Lower Extremity Assessment Lower Extremity Assessment: Defer to PT evaluation;RLE deficits/detail RLE Deficits / Details: pt with decreased strength and ROM limitations secondary to post-op pain and weakness; pt able to maintain NWB R LE throughout independently       Communication Communication Communication: No difficulties   Cognition Arousal/Alertness: Awake/alert Behavior During Therapy: WFL for tasks assessed/performed Overall Cognitive Status: Within Functional Limits for tasks assessed                                     General Comments  vss;pt verbalized understanding to call for assistance and to not get up alone,    Exercises     Shoulder Instructions      Home Living Family/patient expects to be discharged to:: Private residence Living Arrangements: Alone Available Help at Discharge: Family;Friend(s);Available PRN/intermittently Type of Home: House Home Access: Ramped entrance     Home Layout: One level     Bathroom Shower/Tub: Producer, television/film/video: Handicapped height Bathroom Accessibility: Yes How Accessible: Accessible via walker Home Equipment: Cane - single point;Walker - 2 wheels;Wheelchair - manual;Shower seat;Grab bars - toilet   Additional Comments: does not have throw rugs, fell going from her kitchen into the bedroom and reports she "got tripped up"      Prior Functioning/Environment Level of Independence: Independent with assistive device(s)        Comments: ambulates with RW, drives, does her own grocery shopping        OT Problem List: Decreased strength;Decreased activity tolerance;Decreased range of motion;Impaired balance (sitting and/or standing);Pain      OT Treatment/Interventions: Self-care/ADL training;Therapeutic exercise;DME and/or AE instruction;Therapeutic activities;Patient/family education;Balance training    OT Goals(Current goals can be found in the care  plan section) Acute Rehab OT Goals Patient Stated Goal: to get stronger OT Goal Formulation: With patient Time For Goal Achievement: 06/09/19 Potential to Achieve Goals: Good ADL Goals Pt Will Perform Grooming: with modified independence;sitting;standing Pt Will Perform Upper Body Dressing: with modified independence;sitting Pt Will Perform Lower Body Dressing: with adaptive equipment;sit to/from stand;with min guard assist Pt Will Transfer to Toilet: stand pivot transfer;with min assist;with min guard assist Pt Will Perform Toileting - Clothing Manipulation and hygiene: sit to/from stand;with min assist;with min guard assist  OT Frequency: Min 2X/week   Barriers to D/C: Decreased caregiver support  pt does not have 24/7 support available       Co-evaluation PT/OT/SLP Co-Evaluation/Treatment: Yes Reason for Co-Treatment: To address functional/ADL transfers;For patient/therapist safety PT goals addressed during session: Mobility/safety with mobility;Balance;Proper use of DME;Strengthening/ROM OT goals addressed during session: ADL's and self-care      AM-PAC OT "6 Clicks" Daily Activity     Outcome Measure Help from another person eating meals?: A Little Help from another person taking care of personal grooming?: A Little Help from another person toileting, which includes using toliet, bedpan, or urinal?: A Lot Help from another person bathing (including washing, rinsing, drying)?: A Lot Help from another person to put on and taking off regular upper body clothing?: A Little Help from another person to put on and taking off regular lower body clothing?: A Lot 6  Click Score: 15   End of Session Equipment Utilized During Treatment: Gait belt(sara stedy) Nurse Communication: Mobility status  Activity Tolerance: Patient tolerated treatment well Patient left: in chair;with call bell/phone within reach  OT Visit Diagnosis: Unsteadiness on feet (R26.81);Other abnormalities of gait  and mobility (R26.89);Muscle weakness (generalized) (M62.81);History of falling (Z91.81);Pain Pain - Right/Left: Right Pain - part of body: Leg                Time: 3536-1443 OT Time Calculation (min): 24 min Charges:  OT General Charges $OT Visit: 1 Visit OT Evaluation $OT Eval Moderate Complexity: 1 Mod  Diona Browner OTR/L Acute Rehabilitation Services Office: [phone]   Rebeca Alert 05/26/2019, 10:30 AM

## 2019-05-26 NOTE — Progress Notes (Signed)
CSW acknowledges consult for SNF. The patient will require PT/OT evaluations. CSW will assist with disposition planning once the evaluations have been completed.    CSW will continue to follow.  

## 2019-05-26 NOTE — Plan of Care (Signed)
  Problem: Education: Goal: Knowledge of General Education information will improve Description: Including pain rating scale, medication(s)/side effects and non-pharmacologic comfort measures Outcome: Progressing   Problem: Coping: Goal: Level of anxiety will decrease Outcome: Progressing   

## 2019-05-27 ENCOUNTER — Encounter (HOSPITAL_COMMUNITY): Payer: Self-pay | Admitting: *Deleted

## 2019-05-27 LAB — CBC
HCT: 32.5 % — ABNORMAL LOW (ref 36.0–46.0)
Hemoglobin: 10.3 g/dL — ABNORMAL LOW (ref 12.0–15.0)
MCH: 31.1 pg (ref 26.0–34.0)
MCHC: 31.7 g/dL (ref 30.0–36.0)
MCV: 98.2 fL (ref 80.0–100.0)
Platelets: 187 10*3/uL (ref 150–400)
RBC: 3.31 MIL/uL — ABNORMAL LOW (ref 3.87–5.11)
RDW: 13.5 % (ref 11.5–15.5)
WBC: 8 10*3/uL (ref 4.0–10.5)
nRBC: 0 % (ref 0.0–0.2)

## 2019-05-27 LAB — GLUCOSE, CAPILLARY
Glucose-Capillary: 229 mg/dL — ABNORMAL HIGH (ref 70–99)
Glucose-Capillary: 229 mg/dL — ABNORMAL HIGH (ref 70–99)
Glucose-Capillary: 178 mg/dL — ABNORMAL HIGH (ref 70–99)
Glucose-Capillary: 171 mg/dL — ABNORMAL HIGH (ref 70–99)

## 2019-05-27 LAB — BASIC METABOLIC PANEL
Anion gap: 11 (ref 5–15)
BUN: 46 mg/dL — ABNORMAL HIGH (ref 8–23)
CO2: 24 mmol/L (ref 22–32)
Calcium: 8.4 mg/dL — ABNORMAL LOW (ref 8.9–10.3)
Chloride: 95 mmol/L — ABNORMAL LOW (ref 98–111)
Creatinine, Ser: 1.35 mg/dL — ABNORMAL HIGH (ref 0.44–1.00)
GFR calc Af Amer: 44 mL/min — ABNORMAL LOW (ref >60)
GFR calc non Af Amer: 38 mL/min — ABNORMAL LOW (ref >60)
Glucose, Bld: 232 mg/dL — ABNORMAL HIGH (ref 70–99)
Potassium: 4.9 mmol/L (ref 3.5–5.1)
Sodium: 130 mmol/L — ABNORMAL LOW (ref 135–145)

## 2019-05-27 MED ORDER — ATORVASTATIN CALCIUM 10 MG PO TABS
20.0000 mg | ORAL_TABLET | Freq: Every day | ORAL | Status: DC
  Administered 2019-05-28 – 2019-05-30 (×3): 20 mg via ORAL
  Filled 2019-05-27 (×3): qty 2

## 2019-05-27 MED ORDER — POLYETHYLENE GLYCOL 3350 17 G PO PACK
17.0000 g | PACK | Freq: Every day | ORAL | Status: DC | PRN
  Administered 2019-05-27: 17 g via ORAL
  Filled 2019-05-27 (×2): qty 1

## 2019-05-27 MED ORDER — CLONAZEPAM 1 MG PO TABS
1.0000 mg | ORAL_TABLET | Freq: Two times a day (BID) | ORAL | Status: DC | PRN
  Administered 2019-05-29: 1 mg via ORAL
  Filled 2019-05-27: qty 1

## 2019-05-27 MED ORDER — LORATADINE 10 MG PO TABS
10.0000 mg | ORAL_TABLET | Freq: Every day | ORAL | Status: DC
  Administered 2019-05-28 – 2019-05-30 (×3): 10 mg via ORAL
  Filled 2019-05-27 (×3): qty 1

## 2019-05-27 MED ORDER — ROPINIROLE HCL 0.5 MG PO TABS
0.5000 mg | ORAL_TABLET | Freq: Every day | ORAL | Status: DC
  Administered 2019-05-27 – 2019-05-29 (×3): 0.5 mg via ORAL
  Filled 2019-05-27 (×3): qty 1

## 2019-05-27 MED ORDER — DIFLUPREDNATE 0.05 % OP EMUL: 1.0000 [drp] | Freq: Two times a day (BID) | OPHTHALMIC | Status: DC

## 2019-05-27 MED ORDER — INSULIN ASPART 100 UNIT/ML ~~LOC~~ SOLN
8.0000 [IU] | Freq: Three times a day (TID) | SUBCUTANEOUS | Status: DC
  Administered 2019-05-27 (×2): 8 [IU] via SUBCUTANEOUS

## 2019-05-27 MED ORDER — INSULIN GLARGINE 100 UNIT/ML ~~LOC~~ SOLN
65.0000 [IU] | Freq: Every day | SUBCUTANEOUS | Status: DC
  Administered 2019-05-27: 65 [IU] via SUBCUTANEOUS
  Filled 2019-05-27 (×2): qty 0.65

## 2019-05-27 NOTE — Progress Notes (Signed)
Orthopaedic Trauma Progress Note  S: Pain controlled. No orthopaedic issues.  O:  Vitals:   05/27/19 0806 05/27/19 0816  BP:  (!) 101/48  Pulse:  (!) 49  Resp:  17  Temp:  97.6 F (36.4 C)  SpO2: 96% 96%    NAD RLE: dressing clean, dry and intact. Removed and incisions clean and dry. Compartments soft and compressible. Neuro intact motor and sensory function. Warm and well perfused foot.  Imaging: Stable postop imaging  Labs:  Results for orders placed or performed during the hospital encounter of 05/24/19 (from the past 24 hour(s))  Glucose, capillary     Status: Abnormal   Collection Time: 05/26/19 11:56 AM  Result Value Ref Range   Glucose-Capillary 303 (H) 70 - 99 mg/dL  Glucose, capillary     Status: Abnormal   Collection Time: 05/26/19  4:39 PM  Result Value Ref Range   Glucose-Capillary 339 (H) 70 - 99 mg/dL  Basic metabolic panel     Status: Abnormal   Collection Time: 05/26/19  6:21 PM  Result Value Ref Range   Sodium 129 (L) 135 - 145 mmol/L   Potassium 4.4 3.5 - 5.1 mmol/L   Chloride 96 (L) 98 - 111 mmol/L   CO2 23 22 - 32 mmol/L   Glucose, Bld 259 (H) 70 - 99 mg/dL   BUN 42 (H) 8 - 23 mg/dL   Creatinine, Ser 1.20 (H) 0.44 - 1.00 mg/dL   Calcium 8.8 (L) 8.9 - 10.3 mg/dL   GFR calc non Af Amer 44 (L) >60 mL/min   GFR calc Af Amer 51 (L) >60 mL/min   Anion gap 10 5 - 15  Glucose, capillary     Status: Abnormal   Collection Time: 05/26/19  9:33 PM  Result Value Ref Range   Glucose-Capillary 155 (H) 70 - 99 mg/dL  Basic metabolic panel     Status: Abnormal   Collection Time: 05/27/19  5:37 AM  Result Value Ref Range   Sodium 130 (L) 135 - 145 mmol/L   Potassium 4.9 3.5 - 5.1 mmol/L   Chloride 95 (L) 98 - 111 mmol/L   CO2 24 22 - 32 mmol/L   Glucose, Bld 232 (H) 70 - 99 mg/dL   BUN 46 (H) 8 - 23 mg/dL   Creatinine, Ser 1.35 (H) 0.44 - 1.00 mg/dL   Calcium 8.4 (L) 8.9 - 10.3 mg/dL   GFR calc non Af Amer 38 (L) >60 mL/min   GFR calc Af Amer 44 (L) >60  mL/min   Anion gap 11 5 - 15  CBC     Status: Abnormal   Collection Time: 05/27/19  5:37 AM  Result Value Ref Range   WBC 8.0 4.0 - 10.5 K/uL   RBC 3.31 (L) 3.87 - 5.11 MIL/uL   Hemoglobin 10.3 (L) 12.0 - 15.0 g/dL   HCT 32.5 (L) 36.0 - 46.0 %   MCV 98.2 80.0 - 100.0 fL   MCH 31.1 26.0 - 34.0 pg   MCHC 31.7 30.0 - 36.0 g/dL   RDW 13.5 11.5 - 15.5 %   Platelets 187 150 - 400 K/uL   nRBC 0.0 0.0 - 0.2 %  Glucose, capillary     Status: Abnormal   Collection Time: 05/27/19  6:50 AM  Result Value Ref Range   Glucose-Capillary 229 (H) 70 - 99 mg/dL    Assessment: 78 year old F s/p fall  Injuries: Right distal tibia fracture  Weightbearing: NWB RLE, okay to  work on knee and ankle ROM as tolerated  Insicional and dressing care: Dressing change PRN  Orthopedic device(s):PRAFO to wear in bed and when up with therapy  CV/Blood loss: Hgb this AM 10.3, ABLA, no need for transfusion  Pain management: 1. Tylenol 1000mg  q 6 hours 2. Dilaudid 0.5-1mg  q3 hours PRN 3. Robaxin 500 mg q 6 hours PRN 4. Tramadol 50-100 mg q 6 hours PRN  VTE prophylaxis: Lovenox 40 mg daily   ID: Ancef 2 gm postop for 24 hours-completed  Medical co-morbidities: 1. T2DM-sugars improved, still need good control to decrease risk of postoperative infection and complications 2. CHF/COPD-stable, per primary team  Impediments to Fracture Healing: Obesity and glycemic control  Dispo: PT/OT pending, will need SNF  Follow - up plan: 2-3 weeks post discharge in office with me for x-rays and suture removal  , MD [facility] [phone] (office) [email or web address]

## 2019-05-27 NOTE — TOC Initial Note (Signed)
Transition of Care [facility]) - Initial/Assessment Note    Patient Details  Name: Carly Rosario MRN: [id number] Date of Birth: [birth year]  [facility] CM/SW Contact:    Bary Castilla, LCSW Phone Number: [phone] 05/27/2019, 11:57 AM  Clinical Narrative:                  CSW met with patient bedside to discuss the recommendation of a SNF. Patient was aware of the recommendation and was in agreement. CSW provided patient with the medicare.gov rating list and patient stated that her preference was [facility] and would like to be placed near her home in [place]. CSW received permission to fax out referrals to surrounding cities as well. Patient stated that she did not want to go to [facility]. Patient informed CSW that she would like her son Edd Arbour to be included in the decision making process and asked if CSW could follow up with him.  CSW spoke to patient's son Edd Arbour and he stated that he spoke to patient and is aware of the conversation and is in agreement as well.   CSW called [facility] and was unable to reach anyone.  TOC team will continue to follow for discharge planning needs.  .Expected Discharge Plan: Skilled Nursing Facility Barriers to Discharge: Continued Medical Work up, Ship broker, SNF Pending bed offer   Patient Goals and CMS Choice   CMS Medicare.gov Compare Post Acute Care list provided to:: Patient Choice offered to / list presented to : Patient  Expected Discharge Plan and Services Expected Discharge Plan: [place]       Living arrangements for the past 2 months: Single Family Home                                      Prior Living Arrangements/Services Living arrangements for the past 2 months: Single Family Home Lives with:: Self Patient language and need for interpreter reviewed:: Yes Do you feel safe going back to the place where you live?: Yes        Care giver support system in place?: Yes  (comment)   Criminal Activity/Legal Involvement Pertinent to Current Situation/Hospitalization: No - Comment as needed  Activities of Daily Living Home Assistive Devices/Equipment: Walker (specify type) ADL Screening (condition at time of admission) Patient's cognitive ability adequate to safely complete daily activities?: Yes Is the patient deaf or have difficulty hearing?: No Does the patient have difficulty seeing, even when wearing glasses/contacts?: No Does the patient have difficulty concentrating, remembering, or making decisions?: No Patient able to express need for assistance with ADLs?: Yes Does the patient have difficulty dressing or bathing?: No Independently performs ADLs?: Yes (appropriate for developmental age) Does the patient have difficulty walking or climbing stairs?: Yes Weakness of Legs: Both Weakness of Arms/Hands: None  Permission Sought/Granted Permission sought to share information with : Family Supports Permission granted to share information with : Yes, Verbal Permission Granted  Share Information with NAME: Edd Arbour  Permission granted to share info w AGENCY: SNFs  Permission granted to share info w Relationship: [name]  Permission granted to share info w Contact Information: [phone]  Emotional Assessment Appearance:: Appears stated age Attitude/Demeanor/Rapport: Engaged, Gracious Affect (typically observed): Accepting, Adaptable, Hopeful Orientation: : Oriented to Self, Oriented to Place, Oriented to  Time, Oriented to Situation      Admission diagnosis:  Fall [W19.XXXA] Tibia/fibula fracture, right,  closed, initial encounter [S82.201A, S82.401A] Longstanding persistent atrial fibrillation ([place]) [I48.11] Patient Active Problem List   Diagnosis Date Noted  . Right tibial fracture 05/24/2019  . Chronic atrial fibrillation ([place]) 10/08/2017  . Physical debility 10/08/2017  . Diabetic peripheral neuropathy associated with type 2 diabetes mellitus  ([place]) 04/27/2017  . Depressive disorder 03/09/2016  . Risk for falls 09/02/2015  . GAD (generalized anxiety disorder) 08/12/2015  . Ataxia 12/31/2014  . Bilateral lower extremity edema 12/19/2014  . COPD (chronic obstructive pulmonary disease) ([place]) 01/28/2014  . Gout 11/09/2013  . Essential hypertension 09/21/2013  . Type 2 diabetes mellitus with hypercholesterolemia ([place]) 08/15/2013  . 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 Cardiomyopathy ([place]) 12/23/2012  . DDD (degenerative disc disease), lumbosacral 12/23/2012  . Morbid obesity with BMI of 40.0-44.9, adult ([place]) 12/23/2012   PCP:  Concepcion Elk, MD Pharmacy:   [id number] [id number] [id number]: [phone]: [phone]    Social Determinants of Health (SDOH) Interventions    Readmission Risk Interventions No flowsheet data found.

## 2019-05-27 NOTE — Discharge Instructions (Signed)
Orthopaedic Trauma Service Discharge Instructions   General Discharge Instructions  WEIGHT BEARING STATUS: Non-weightbearing on right lower extremity  RANGE OF MOTION/ACTIVITY: Okay for gentle ankle and knee range of motion as you can tolerate  Wound Care: Incisions can be left open to air if there is no drainage. If incision continues to have drainage, follow wound care instructions below. Okay to shower if no drainage from incisions.  DVT/PE prophylaxis: Lovenox  Diet: as you were eating previously.  Can use over the counter stool softeners and bowel preparations, such as Miralax, to help with bowel movements.  Narcotics can be constipating.  Be sure to drink plenty of fluids  PAIN MEDICATION USE AND EXPECTATIONS  You have likely been given narcotic medications to help control your pain.  After a traumatic event that results in an fracture (broken bone) with or without surgery, it is ok to use narcotic pain medications to help control one's pain.  We understand that everyone responds to pain differently and each individual patient will be evaluated on a regular basis for the continued need for narcotic medications. Ideally, narcotic medication use should last no more than 6-8 weeks (coinciding with fracture healing).   As a patient it is your responsibility as well to monitor narcotic medication use and report the amount and frequency you use these medications when you come to your office visit.   We would also advise that if you are using narcotic medications, you should take a dose prior to therapy to maximize you participation.  IF YOU ARE ON NARCOTIC MEDICATIONS IT IS NOT PERMISSIBLE TO OPERATE A MOTOR VEHICLE (MOTORCYCLE/CAR/TRUCK/MOPED) OR HEAVY MACHINERY DO NOT MIX NARCOTICS WITH OTHER CNS (CENTRAL NERVOUS SYSTEM) DEPRESSANTS SUCH AS ALCOHOL   STOP SMOKING OR USING NICOTINE PRODUCTS!!!!  As discussed nicotine severely impairs your body's ability to heal surgical and traumatic  wounds but also impairs bone healing.  Wounds and bone heal by forming microscopic blood vessels (angiogenesis) and nicotine is a vasoconstrictor (essentially, shrinks blood vessels).  Therefore, if vasoconstriction occurs to these microscopic blood vessels they essentially disappear and are unable to deliver necessary nutrients to the healing tissue.  This is one modifiable factor that you can do to dramatically increase your chances of healing your injury.    (This means no smoking, no nicotine gum, patches, etc)  DO NOT USE NONSTEROIDAL ANTI-INFLAMMATORY DRUGS (NSAID'S)  Using products such as Advil (ibuprofen), Aleve (naproxen), Motrin (ibuprofen) for additional pain control during fracture healing can delay and/or prevent the healing response.  If you would like to take over the counter (OTC) medication, Tylenol (acetaminophen) is ok.  However, some narcotic medications that are given for pain control contain acetaminophen as well. Therefore, you should not exceed more than 4000 mg of tylenol in a day if you do not have liver disease.  Also note that there are may OTC medicines, such as cold medicines and allergy medicines that my contain tylenol as well.  If you have any questions about medications and/or interactions please ask your doctor/PA or your pharmacist.      ICE AND ELEVATE INJURED/OPERATIVE EXTREMITY  Using ice and elevating the injured extremity above your heart can help with swelling and pain control.  Icing in a pulsatile fashion, such as 20 minutes on and 20 minutes off, can be followed.    Do not place ice directly on skin. Make sure there is a barrier between to skin and the ice pack.    Using frozen items such  as frozen peas works well as the conform nicely to the are that needs to be iced.  USE AN ACE WRAP OR TED HOSE FOR SWELLING CONTROL  In addition to icing and elevation, Ace wraps or TED hose are used to help limit and resolve swelling.  It is recommended to use Ace wraps or  TED hose until you are informed to stop.    When using Ace Wraps start the wrapping distally (farthest away from the body) and wrap proximally (closer to the body)   Example: If you had surgery on your leg or thing and you do not have a splint on, start the ace wrap at the toes and work your way up to the thigh        If you had surgery on your upper extremity and do not have a splint on, start the ace wrap at your fingers and work your way up to the upper arm  IF YOU ARE IN A SPLINT OR CAST DO NOT [place]   If your splint gets wet for any reason please contact the office immediately. You may shower in your splint or cast as long as you keep it dry.  This can be done by wrapping in a cast cover or garbage back (or similar)  Do Not stick any thing down your splint or cast such as pencils, money, or hangers to try and scratch yourself with.  If you feel itchy take benadryl as prescribed on the bottle for itching  IF YOU ARE IN A CAM BOOT (BLACK BOOT)  You may remove boot periodically. Perform daily dressing changes as noted below.  Wash the liner of the boot regularly and wear a sock when wearing the boot. It is recommended that you sleep in the boot until told otherwise   CALL THE OFFICE WITH ANY QUESTIONS OR CONCERNS: [phone]   VISIT OUR WEBSITE FOR ADDITIONAL INFORMATION: [email or web address]    Discharge Wound Care Instructions  Do NOT apply any ointments, solutions or lotions to pin sites or surgical wounds.  These prevent needed drainage and even though solutions like hydrogen peroxide kill bacteria, they also damage cells lining the pin sites that help fight infection.  Applying lotions or ointments can keep the wounds moist and can cause them to breakdown and open up as well. This can increase the risk for infection. When in doubt call the office.  Surgical incisions should be dressed daily.  If any drainage is noted, use one layer of adaptic, then gauze, Kerlix, and  an ace wrap.  Once the incision is completely dry and without drainage, it may be left open to air out.  Showering may begin 36-48 hours later.  Cleaning gently with soap and water.  Traumatic wounds should be dressed daily as well.    One layer of adaptic, gauze, Kerlix, then ace wrap.  The adaptic can be discontinued once the draining has ceased    If you have a wet to dry dressing: wet the gauze with saline the squeeze as much saline out so the gauze is moist (not soaking wet), place moistened gauze over wound, then place a dry gauze over the moist one, followed by Kerlix wrap, then ace wrap.

## 2019-05-27 NOTE — [place] FL2 (Signed)
[place] MEDICAID FL2 LEVEL OF CARE SCREENING TOOL     IDENTIFICATION  Patient Name: Carly Rosario Birthdate: 29-Jun-1941 Sex: female Admission Date (Current Location): 05/24/2019  [facility] and [place] Number:  Producer, television/film/video and Address:  [facility], [address], [place], [place] [address]      Provider Number: [id number]  Attending Physician Name and Address:  Tyson Alias, *  Relative Name and Phone Number:  [name] [phone]    Current Level of Care: Hospital Recommended Level of Care: Skilled Nursing Facility Prior Approval Number:    Date Approved/Denied:   PASRR Number: [id number]  Discharge Plan: SNF    Current Diagnoses: Patient Active Problem List   Diagnosis Date Noted  . Right tibial fracture 05/24/2019  . Chronic atrial fibrillation (HCC) 10/08/2017  . Physical debility 10/08/2017  . Diabetic peripheral neuropathy associated with type 2 diabetes mellitus (HCC) 04/27/2017  . Depressive disorder 03/09/2016  . Risk for falls 09/02/2015  . GAD (generalized anxiety disorder) 08/12/2015  . Ataxia 12/31/2014  . Bilateral lower extremity edema 12/19/2014  . COPD (chronic obstructive pulmonary disease) (HCC) 01/28/2014  . Gout 11/09/2013  . Essential hypertension 09/21/2013  . 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 Cardiomyopathy (HCC) 12/23/2012  . DDD (degenerative disc disease), lumbosacral 12/23/2012  . Morbid obesity with BMI of 40.0-44.9, adult (HCC) 12/23/2012    Orientation RESPIRATION BLADDER Height & Weight     Self, Time, Situation, Place  Normal External catheter, Continent Weight: 260 lb 2.3 oz (118 kg) Height:  5\' 7"  (170.2 cm)  BEHAVIORAL SYMPTOMS/MOOD NEUROLOGICAL BOWEL NUTRITION STATUS      Continent Diet(Carb Modified)  AMBULATORY STATUS COMMUNICATION OF NEEDS Skin   Extensive Assist Verbally Surgical wounds, Skin  abrasions(right ankle, MASD, Compressed wrap, abrasions arm and chest)                       Personal Care Assistance Level of Assistance  Bathing, Feeding, Dressing, Total care Bathing Assistance: Limited assistance Feeding assistance: Independent Dressing Assistance: Limited assistance Total Care Assistance: Limited assistance   Functional Limitations Info  Sight, Hearing, Speech Sight Info: Adequate Hearing Info: Impaired Speech Info: Adequate    SPECIAL CARE FACTORS FREQUENCY  PT (By licensed PT), OT (By licensed OT)     PT Frequency: 5X per week OT Frequency: 5X per week            Contractures Contractures Info: Not present    Additional Factors Info  Code Status, Allergies, Insulin Sliding Scale Code Status Info: Full Allergies Info: NICKEL,TETRACYCLINES & RELATED,ALLOPURINOL,CITALOPRAM HYDROBROMIDE,METFORMIN,PRAVASTATIN,PROPOXYPHENE,SULFA ANTIBIOTICS   Insulin Sliding Scale Info: insulin aspart (novoLOG) injection 0-15 Units       Current Medications (05/27/2019):  This is the current hospital active medication list Current Facility-Administered Medications  Medication Dose Route Frequency Provider Last Rate Last Dose  . acetaminophen (TYLENOL) tablet 1,000 mg  1,000 mg Oral Q6H 13/02/2019 A, PA-C   1,000 mg at 05/27/19 13/08/20  . aspirin chewable tablet 81 mg  81 mg Oral Daily Agyei, Obed K, MD   81 mg at 05/27/19 1041  . atorvastatin (LIPITOR) tablet 20 mg  20 mg Oral q1800 13/08/20 A, PA-C   20 mg at 05/26/19 1730  . Chlorhexidine Gluconate Cloth 2 % PADS 6 each  6 each Topical Q0600 13/07/20, PA-C   6 each at 05/27/19 13/08/20  .  clotrimazole (LOTRIMIN) 1 % cream   Topical BID Delray Alt, PA-C   1 application at 46/80/32 1042  . Difluprednate 0.05 % EMUL 1 drop  1 drop Both Eyes BID Agyei, Obed K, MD      . digoxin (LANOXIN) tablet 0.125 mg  0.125 mg Oral Beverly Gust Patrecia Pace A, PA-C   0.125 mg at 05/26/19 0940  . enoxaparin (LOVENOX)  injection 40 mg  40 mg Subcutaneous Q24H Patrecia Pace A, PA-C   40 mg at 05/27/19 1043  . escitalopram (LEXAPRO) tablet 10 mg  10 mg Oral Daily Patrecia Pace A, PA-C   10 mg at 05/27/19 1043  . gabapentin (NEURONTIN) capsule 100 mg  100 mg Oral TID Patrecia Pace A, PA-C   100 mg at 05/27/19 1043  . HYDROmorphone (DILAUDID) injection 0.5-1 mg  0.5-1 mg Intravenous Q3H PRN Delray Alt, PA-C   1 mg at 05/26/19 1224  . insulin aspart (novoLOG) injection 0-15 Units  0-15 Units Subcutaneous TID WC Delray Alt, PA-C   5 Units at 05/27/19 0910  . insulin aspart (novoLOG) injection 8 Units  8 Units Subcutaneous TID WC Agyei, Obed K, MD      . insulin glargine (LANTUS) injection 65 Units  65 Units Subcutaneous Daily Jean Rosenthal, MD   65 Units at 05/27/19 1120  . methocarbamol (ROBAXIN) tablet 500 mg  500 mg Oral Q6H PRN Delray Alt, PA-C   500 mg at 05/27/19 8250  . mometasone-formoterol (DULERA) 100-5 MCG/ACT inhaler 2 puff  2 puff Inhalation BID Delray Alt, PA-C   2 puff at 05/27/19 0805  . mupirocin ointment (BACTROBAN) 2 % 1 application  1 application Nasal BID Delray Alt, PA-C   1 application at 03/70/48 1044  . pneumococcal 23 valent vaccine (PNU-IMMUNE) injection 0.5 mL  0.5 mL Intramuscular Tomorrow-1000 Axel Filler, MD      . promethazine (PHENERGAN) tablet 12.5 mg  12.5 mg Oral Q6H PRN Delray Alt, PA-C      . senna-docusate (Senokot-S) tablet 1 tablet  1 tablet Oral QHS PRN Delray Alt, PA-C   1 tablet at 05/25/19 2211  . traMADol (ULTRAM) tablet 50-100 mg  50-100 mg Oral Q6H PRN Delray Alt, PA-C   50 mg at 05/27/19 0001     Discharge Medications: Please see discharge summary for a list of discharge medications.  Relevant Imaging Results:  Relevant Lab Results:   Additional Information ss# [place], [place]

## 2019-05-27 NOTE — Plan of Care (Signed)
  Problem: Education: Goal: Knowledge of General Education information will improve Description: Including pain rating scale, medication(s)/side effects and non-pharmacologic comfort measures Outcome: Progressing   Problem: Education: Goal: Knowledge of General Education information will improve Description: Including pain rating scale, medication(s)/side effects and non-pharmacologic comfort measures Outcome: Progressing   Problem: Health Behavior/Discharge Planning: Goal: Ability to manage health-related needs will improve Outcome: Progressing   Problem: Clinical Measurements: Goal: Ability to maintain clinical measurements within normal limits will improve Outcome: Progressing Goal: Will remain free from infection Outcome: Progressing   Problem: Activity: Goal: Risk for activity intolerance will decrease Outcome: Progressing   Problem: Nutrition: Goal: Adequate nutrition will be maintained Outcome: Progressing   Problem: Coping: Goal: Level of anxiety will decrease Outcome: Progressing   Problem: Elimination: Goal: Will not experience complications related to bowel motility Outcome: Progressing   Problem: Pain Managment: Goal: General experience of comfort will improve Outcome: Progressing   Problem: Safety: Goal: Ability to remain free from injury will improve Outcome: Progressing   Problem: Skin Integrity: Goal: Risk for impaired skin integrity will decrease Outcome: Progressing

## 2019-05-27 NOTE — Progress Notes (Addendum)
Subjective: HD#3   Overnight: No acute events reported  Today, Carly Rosario states she is doing well and only endorses minimal right lower extremity pain.  I updated her regarding aggressive management of her diabetes and also acute rehab placement.  She states that she would have social work contact her son.  Objective:  Vital signs in last 24 hours: Vitals:   05/26/19 1446 05/26/19 1937 05/26/19 2040 05/27/19 0338  BP: (!) 102/56   (!) 110/51  Pulse: (!) 56 (!) 51 (!) 52 (!) 58  Resp: 19 18 18 16   Temp:  98.1 F (36.7 C)  97.6 F (36.4 C)  TempSrc:  Oral  Oral  SpO2: 96% 96% 96% 97%  Weight:      Height:       Const: In no apparent distress, lying comfortably in bed, conversational Ext: Able to wiggle her toes on the right foot, sensation to light touch intact  Assessment/Plan:  Principal Problem:   Right tibial fracture Active Problems:   Morbid obesity with BMI of 40.0-44.9, adult (HCC)   Chronic atrial fibrillation (HCC)   Physical debility   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    Right Tibia/Fibula Fracture s/p ORIF on 11/6 - Post-op day 2 Doing very well postop with little to no right lower extremity pain.  She is agreeable to skilled nursing facility placement and currently pending social work assistance.  She has only required 1 mg of Dilaudid in the past 24 hours. - Orthopedic following              - pt non weight bearing in RLE in PRAFO boot             - enoxaparin for DVT ppx post-operatively             - mobilize with PT - pain management with tramadol 50-100mg  q6h PRN and Dilaudid 0.5-1mg  q3hrs PRN for breakthrough  Mechanical Fall - resulting in fragility fracture - PT/OT  recommending SNF placement  - follow-up with PCP for initiation of bisphosphonate therapy in a few months  ?AKI vs CKD Baseline sCr unknown. Her sCr on arrival was 1.3 but improved. sCr this am 1.3<1.2. She has HFrEF with EF(40-45%) on Lasix 80mg  po. Overall she is net + 1.6L since admission but has only had about 800cc UOP in the last 24 hours - Hold Lasix for now  - Obtain bladder scan  Type 2Diabetes Mellitus - uncontrolled A1c 14.6 on admission. CBG this am 229<<155.  She still has elevated prandial and fasting glucose and will adjust her long-acting and prandial insulin. - Lantus to 60u qhs to 65u qhs - Continue novolog 5 units TID w/ meals to 8u TID w/ meals -moderate SSI -CBGmonitoring - will need close Glc control post-operatively to optimize healing  Permanent Atrial Fibrillation - stable Rate controlledwith digoxin. CHADS2-VAScscore 6, not onanticoagulation due to history of GI bleed. - continue digoxin 13/6 every other day - discontinue cardiac monitoring  Candidal intertrigo -continueclotrimazole1% cream BID  HFrEF No sign of volume overload - holdfurosemide80mg PO daily - continue digoxin every other day  HTN BP stable this am  - holding homelisinopril  Diet - carb modified Fluids - none DVT ppx - enoxaparin 40mg  subQ daily CODE STATUS -FULL CODE  Dispo: Anticipated dischargepending functional status  with post-operative PT. Suspect pt will need short-term rehab after discharge and prior to returning home.  Carly Rosenthal, MD 05/27/2019, 6:07 AM Pager: [phone] Internal Medicine Teaching Service

## 2019-05-28 ENCOUNTER — Inpatient Hospital Stay (HOSPITAL_COMMUNITY): Payer: Medicare Other

## 2019-05-28 DIAGNOSIS — E118 Type 2 diabetes mellitus with unspecified complications: Secondary | ICD-10-CM

## 2019-05-28 LAB — BASIC METABOLIC PANEL
Anion gap: 9 (ref 5–15)
BUN: 54 mg/dL — ABNORMAL HIGH (ref 8–23)
CO2: 25 mmol/L (ref 22–32)
Calcium: 8.4 mg/dL — ABNORMAL LOW (ref 8.9–10.3)
Chloride: 96 mmol/L — ABNORMAL LOW (ref 98–111)
Creatinine, Ser: 1.34 mg/dL — ABNORMAL HIGH (ref 0.44–1.00)
GFR calc Af Amer: 44 mL/min — ABNORMAL LOW (ref >60)
GFR calc non Af Amer: 38 mL/min — ABNORMAL LOW (ref >60)
Glucose, Bld: 235 mg/dL — ABNORMAL HIGH (ref 70–99)
Potassium: 5 mmol/L (ref 3.5–5.1)
Sodium: 130 mmol/L — ABNORMAL LOW (ref 135–145)

## 2019-05-28 LAB — GLUCOSE, CAPILLARY
Glucose-Capillary: 215 mg/dL — ABNORMAL HIGH (ref 70–99)
Glucose-Capillary: 141 mg/dL — ABNORMAL HIGH (ref 70–99)
Glucose-Capillary: 156 mg/dL — ABNORMAL HIGH (ref 70–99)
Glucose-Capillary: 182 mg/dL — ABNORMAL HIGH (ref 70–99)

## 2019-05-28 LAB — CBC
HCT: 30.7 % — ABNORMAL LOW (ref 36.0–46.0)
Hemoglobin: 9.8 g/dL — ABNORMAL LOW (ref 12.0–15.0)
MCH: 31 pg (ref 26.0–34.0)
MCHC: 31.9 g/dL (ref 30.0–36.0)
MCV: 97.2 fL (ref 80.0–100.0)
Platelets: 187 10*3/uL (ref 150–400)
RBC: 3.16 MIL/uL — ABNORMAL LOW (ref 3.87–5.11)
RDW: 13.3 % (ref 11.5–15.5)
WBC: 8.1 10*3/uL (ref 4.0–10.5)
nRBC: 0 % (ref 0.0–0.2)

## 2019-05-28 LAB — SARS CORONAVIRUS 2 (TAT 6-24 HRS): SARS Coronavirus 2: NEGATIVE

## 2019-05-28 LAB — DIGOXIN LEVEL: Digoxin Level: 0.2 ng/mL — ABNORMAL LOW (ref 0.8–2.0)

## 2019-05-28 MED ORDER — INSULIN ASPART 100 UNIT/ML ~~LOC~~ SOLN
9.0000 [IU] | Freq: Three times a day (TID) | SUBCUTANEOUS | Status: DC
  Administered 2019-05-28 – 2019-05-29 (×4): 9 [IU] via SUBCUTANEOUS

## 2019-05-28 MED ORDER — INSULIN GLARGINE 100 UNIT/ML ~~LOC~~ SOLN
70.0000 [IU] | Freq: Every day | SUBCUTANEOUS | Status: DC
  Administered 2019-05-28 – 2019-05-29 (×2): 70 [IU] via SUBCUTANEOUS
  Filled 2019-05-28 (×3): qty 0.7

## 2019-05-28 NOTE — Anesthesia Postprocedure Evaluation (Signed)
Anesthesia Post Note  Patient: Shaleigh Laubscher  Procedure(s) Performed: OPEN REDUCTION INTERNAL FIXATION TIBIA/FIBULA FRACTURE (Right )     Patient location during evaluation: PACU Anesthesia Type: Regional and General Level of consciousness: awake and alert, oriented and patient cooperative Pain management: pain level controlled Vital Signs Assessment: post-procedure vital signs reviewed and stable Respiratory status: spontaneous breathing, nonlabored ventilation and respiratory function stable Cardiovascular status: blood pressure returned to baseline and stable Postop Assessment: no apparent nausea or vomiting Anesthetic complications: no    Last Vitals:  Vitals:   05/27/19 2035 05/28/19 0324  BP:  123/64  Pulse:  (!) 53  Resp:  14  Temp:  37.1 C  SpO2: 95% 96%    Last Pain:  Vitals:   05/28/19 0324  TempSrc: Oral  PainSc:    Pain Goal: Patients Stated Pain Goal: 0 (05/26/19 1830)                 Pervis Hocking

## 2019-05-28 NOTE — Progress Notes (Signed)
Inpatient Diabetes Program Recommendations  AACE/ADA: New Consensus Statement on Inpatient Glycemic Control (2015)  Target Ranges:  Prepandial:   less than 140 mg/dL      Peak postprandial:   less than 180 mg/dL (1-2 hours)      Critically ill patients:  140 - 180 mg/dL   Lab Results  Component Value Date   GLUCAP 215 (H) 05/28/2019   HGBA1C 14.6 (H) 05/24/2019    Review of Glycemic Control  Diabetes history: DM 2 Outpatient Diabetes medications: Toujeo 70 units, Humalog 9 units tid Current orders for Inpatient glycemic control:  Lantus 70 units Novolog 0-15 units tid Novolog 9 units tid meal coverage  A1c 14.6%  Inpatient Diabetes Program Recommendations:    Spoke with patient about DM management at home. Discussed A1c level of 14.6%. Discussed glucose and A1c goals.  Patient reports compliance, however based on A1c level, patient is missing insulin doses or is very much under insulinized.  Patient reports she checks her glucose 3 times a day some glucose checks read high. Patient says she drinks Glucerna and microwaves her meals at home.   Son lives down the street but she lives alone. Patient reports she will go to SNF.  Thanks,  Tama Headings RN, MSN, BC-ADM Inpatient Diabetes Coordinator Team Pager [phone] (8a-5p)

## 2019-05-28 NOTE — Plan of Care (Signed)
  Problem: Education: Goal: Knowledge of General Education information will improve Description: Including pain rating scale, medication(s)/side effects and non-pharmacologic comfort measures Outcome: Progressing   Problem: Education: Goal: Knowledge of General Education information will improve Description: Including pain rating scale, medication(s)/side effects and non-pharmacologic comfort measures Outcome: Progressing   Problem: Health Behavior/Discharge Planning: Goal: Ability to manage health-related needs will improve Outcome: Progressing   Problem: Activity: Goal: Risk for activity intolerance will decrease Outcome: Progressing   Problem: Nutrition: Goal: Adequate nutrition will be maintained Outcome: Progressing   

## 2019-05-28 NOTE — Progress Notes (Signed)
Physical Therapy Treatment Patient Details Name: Carly Rosario MRN: [id number] DOB: January 01, 1941 Today's Date: 05/28/2019    History of Present Illness Pt is a 78 y/o female s/p R distal tib/fib fx ORIF secondary to a fall at home. PMH including but not limited to CHF, COPD, HTN and DM.    PT Comments    Pt admitted with above diagnosis. Pt was able to stand to [facility] with min assist of 2 with bed elevated significantly.   Used Stedy to move pt to chair.  Pt stood in [place] for up to 2 min x 2.  Able to maintain NWB right LE.   Pt currently with functional limitations due to balance and endurance deficits. Pt will benefit from skilled PT to increase their independence and safety with mobility to allow discharge to the venue listed below.     Follow Up Recommendations  SNF     Equipment Recommendations  None recommended by PT    Recommendations for Other Services       Precautions / Restrictions Precautions Precautions: Fall Restrictions Weight Bearing Restrictions: Yes RLE Weight Bearing: Non weight bearing    Mobility  Bed Mobility Overal bed mobility: Needs Assistance Bed Mobility: Supine to Sit     Supine to sit: Supervision     General bed mobility comments: supervision for safety; pt achieving sitting position towards her R side  Transfers Overall transfer level: Needs assistance   Transfers: Sit to/from Stand;Stand Pivot Transfers Sit to Stand: +2 physical assistance;Min assist;From elevated surface Stand pivot transfers: Total assist;+2 safety/equipment       General transfer comment: pt able to stand from elevated EOB with min A x2 to power into standing and for stability to [place];  Pt then stood x2 for up to 2 minutes in Stedy to work on standing tolerance and was able to maintain NWB right LE.   Ambulation/Gait             General Gait Details: unable   Stairs             Wheelchair Mobility    Modified Rankin (Stroke Patients Only)        Balance Overall balance assessment: Needs assistance;History of Falls Sitting-balance support: Feet supported;No upper extremity supported Sitting balance-Leahy Scale: Fair     Standing balance support: Bilateral upper extremity supported Standing balance-Leahy Scale: Poor Standing balance comment: reliant on bilateral UEs and external support                            Cognition Arousal/Alertness: Awake/alert Behavior During Therapy: WFL for tasks assessed/performed Overall Cognitive Status: Within Functional Limits for tasks assessed                                        Exercises General Exercises - Lower Extremity Ankle Circles/Pumps: AROM;Both;10 reps;Supine Quad Sets: AROM;Both;10 reps;Supine Long Arc Quad: AROM;Both;10 reps;Seated Heel Slides: AROM;Both;10 reps;Supine    General Comments General comments (skin integrity, edema, etc.): Placed PRAFO on pt.       Pertinent Vitals/Pain Pain Assessment: Faces Faces Pain Scale: Hurts little more Pain Location: R ankle/foot Pain Descriptors / Indicators: Guarding Pain Intervention(s): Limited activity within patient's tolerance;Monitored during session;Repositioned    Home Living  Prior Function            PT Goals (current goals can now be found in the care plan section) Acute Rehab PT Goals Patient Stated Goal: to get stronger Progress towards PT goals: Progressing toward goals    Frequency    Min 3X/week      PT Plan Current plan remains appropriate    Co-evaluation              AM-PAC PT "6 Clicks" Mobility   Outcome Measure  Help needed turning from your back to your side while in a flat bed without using bedrails?: None Help needed moving from lying on your back to sitting on the side of a flat bed without using bedrails?: None Help needed moving to and from a bed to a chair (including a wheelchair)?: Total Help needed  standing up from a chair using your arms (e.g., wheelchair or bedside chair)?: A Lot Help needed to walk in hospital room?: Total Help needed climbing 3-5 steps with a railing? : Total 6 Click Score: 13    End of Session Equipment Utilized During Treatment: Gait belt Activity Tolerance: Patient tolerated treatment well Patient left: in chair;with call bell/phone within reach Nurse Communication: Mobility status;Need for lift equipment(use Stedy) PT Visit Diagnosis: Other abnormalities of gait and mobility (R26.89)     Time: 9629-5284 PT Time Calculation (min) (ACUTE ONLY): 20 min  Charges:  $Therapeutic Activity: 8-22 mins                     Carly Rosario W,PT Acute Rehabilitation Services Pager:  [phone]  Office:  [place] 05/28/2019, 4:20 PM

## 2019-05-28 NOTE — Progress Notes (Signed)
   Subjective: Pt seen at the bedside this morning. Feeling well, minimal R leg pain. She is awaiting SNF placement. No acute concerns.   Objective:  Vital signs in last 24 hours: Vitals:   05/27/19 0816 05/27/19 2010 05/27/19 2035 05/28/19 0324  BP: (!) 101/48 105/76  123/64  Pulse: (!) 49 63  (!) 53  Resp: 17 16  14   Temp: 97.6 F (36.4 C) 98.7 F (37.1 C)  98.8 F (37.1 C)  TempSrc: Oral Oral  Oral  SpO2: 96% 96% 95% 96%  Weight:      Height:       Physical Exam Vitals signs and nursing note reviewed.  Constitutional:      General: She is not in acute distress.    Appearance: She is obese. She is not ill-appearing.  Pulmonary:     Effort: Pulmonary effort is normal.  Musculoskeletal:     Comments: Stitches in place over RLE.   Skin:    General: Skin is warm and dry.  Neurological:     Mental Status: She is alert.    Assessment/Plan:  Principal Problem:   Right tibial fracture Active Problems:   Morbid obesity with BMI of 40.0-44.9, adult (HCC)   Chronic atrial fibrillation (HCC)   Physical debility   Type 2 diabetes mellitus with hypercholesterolemia ([place])   Ms. Headlee is a 78year old Fwith permanent A Fibnot on anticoagulation,nonischemic cardiomyopathy,Type 2 Diabetes,COPD,HTN,andlumbar degeneration, who presented after a mechanicalfallresulting in aright tibia and fibular fracture .    Right Tibia/Fibula Fracture s/p ORIF on 11/6- Post-op day 3 Minimal post-operative RLE pain, has not used any PRN medications. She is aware of pending skilled nursing facility placement.   - Orthopedic following - pt non weight bearing in RLE in [facility] boot in bed and when up with PT - enoxaparin forDVT ppx post-operatively - mobilize with PT  - f/u in 2-3 weeks post discharge for imaging and suture removal - pain management with tylenol 100mg  q6h, Dilaudid 0.5-1mg  q3hrsPRN, robaxin 500mg  q6h PRN, andtramadol 50-100mg   q6h PRN  Mechanical Fall - resulting in fragility fracture - PT/OT recommending SNF placement  - social work placed referral to [facility] on 11/8 - follow-upwith PCPfor initiation of bisphosphonate therapy in a few months  ?AKI vs CKD Baseline Cr 1.08 in 12/2018. Cr on admission 1.3, initially improved, though 1.34 this morning. Urine output 611mL yesterday.  - holding homefurosemide  Type 2Diabetes Mellitus - uncontrolled A1c 14.6 on admission. Prandial and fasting glucose remain elevated.  - increaseLantusto 70u qhs - increase novolog to 9u TID w/ meals -moderate SSI -CBGmonitoring - will need close Glc control post-operatively to optimize healing  Permanent Atrial Fibrillation- stable Rate controlledwith digoxin. CHADS2-VAScscore 6, not onanticoagulation due to history of GI bleed. - continue digoxin 129mcg every other day - will check dig level today 12 hours after morning dose  HFrEF - holding homefurosemide80mg PO daily - continue digoxin 166mcg every other day  HTN - holding homelisinopril  Diet -carb modified Fluids -none DVT ppx -enoxaparin 40mg  subQ daily CODE STATUS -FULL CODE  Dispo: Anticipated dischargepending acceptance of SNF referral. Will get COVID test today.   Ladona Horns, MD 05/28/2019, 6:49 AM Pager: [phone]

## 2019-05-28 NOTE — Progress Notes (Signed)
   05/28/19 1520  SNF Authorization Status  SNF Authorization Type Transition of Care CM/SW Authorization Request  SNF Auth Started 05/28/19  SNF Auth Start Time 1520  SNF Authorization Status [employer] authorization initiated for ST SNF choice.  Midge Minium RN, BSN, NCM-BC, ACM-RN [phone]

## 2019-05-29 ENCOUNTER — Encounter (HOSPITAL_COMMUNITY): Payer: Self-pay | Admitting: Student

## 2019-05-29 LAB — GLUCOSE, CAPILLARY
Glucose-Capillary: 175 mg/dL — ABNORMAL HIGH (ref 70–99)
Glucose-Capillary: 120 mg/dL — ABNORMAL HIGH (ref 70–99)
Glucose-Capillary: 50 mg/dL — ABNORMAL LOW (ref 70–99)
Glucose-Capillary: 67 mg/dL — ABNORMAL LOW (ref 70–99)
Glucose-Capillary: 67 mg/dL — ABNORMAL LOW (ref 70–99)
Glucose-Capillary: 74 mg/dL (ref 70–99)
Glucose-Capillary: 159 mg/dL — ABNORMAL HIGH (ref 70–99)

## 2019-05-29 LAB — BASIC METABOLIC PANEL
Anion gap: 8 (ref 5–15)
BUN: 35 mg/dL — ABNORMAL HIGH (ref 8–23)
CO2: 26 mmol/L (ref 22–32)
Calcium: 8.9 mg/dL (ref 8.9–10.3)
Chloride: 102 mmol/L (ref 98–111)
Creatinine, Ser: 0.91 mg/dL (ref 0.44–1.00)
GFR calc Af Amer: >60 mL/min (ref >60)
GFR calc non Af Amer: >60 mL/min (ref >60)
Glucose, Bld: 179 mg/dL — ABNORMAL HIGH (ref 70–99)
Potassium: 5.2 mmol/L — ABNORMAL HIGH (ref 3.5–5.1)
Sodium: 136 mmol/L (ref 135–145)

## 2019-05-29 MED ORDER — GABAPENTIN 100 MG PO CAPS
200.0000 mg | ORAL_CAPSULE | Freq: Three times a day (TID) | ORAL | Status: DC
  Administered 2019-05-29 – 2019-05-30 (×3): 200 mg via ORAL
  Filled 2019-05-29 (×3): qty 2

## 2019-05-29 MED ORDER — INSULIN GLARGINE 100 UNIT/ML ~~LOC~~ SOLN
70.0000 [IU] | Freq: Every day | SUBCUTANEOUS | Status: DC
  Filled 2019-05-29: qty 0.7

## 2019-05-29 MED ORDER — INSULIN ASPART 100 UNIT/ML ~~LOC~~ SOLN: 9.0000 [IU] | Freq: Three times a day (TID) | SUBCUTANEOUS | Status: DC

## 2019-05-29 MED ORDER — PREDNISOLONE ACETATE 1 % OP SUSP
1.0000 [drp] | Freq: Two times a day (BID) | OPHTHALMIC | Status: DC
  Administered 2019-05-29 – 2019-05-30 (×2): 1 [drp] via OPHTHALMIC
  Filled 2019-05-29: qty 5

## 2019-05-29 MED ORDER — INSULIN ASPART 100 UNIT/ML ~~LOC~~ SOLN
11.0000 [IU] | Freq: Three times a day (TID) | SUBCUTANEOUS | Status: DC
  Administered 2019-05-29: 11 [IU] via SUBCUTANEOUS

## 2019-05-29 MED ORDER — INSULIN GLARGINE 100 UNIT/ML ~~LOC~~ SOLN: 75.0000 [IU] | Freq: Every day | SUBCUTANEOUS | Status: DC

## 2019-05-29 NOTE — Progress Notes (Signed)
Hypoglycemic Event  CBG: 16:57 = 50  Treatment: 1 cup orange juice, 1 cup ice cream and chocolate pudding  Symptoms: Asympomatic  Follow-up CBG: Time:18:04 CBG Result:74  Comments/MD notified:MD notified, new orders received    [facility] [address]

## 2019-05-29 NOTE — Progress Notes (Signed)
   05/29/19 1632  SNF Authorization Status  SNF Authorization Complete 05/29/19  SNF Auth Complete Time 0623  SNF Authorization Status Complete   CM spoke to Anda [facility]); insurance auth received for ST SNF. Auth #: [id number]; HYWV #: [id number].   Midge Minium RN, BSN, NCM-BC, ACM-RN [phone]

## 2019-05-29 NOTE — Progress Notes (Signed)
   Subjective: Pt seen at the bedside this morning. Stated she had some leg pain overnight, but is having none currently. No acute concerns, awaiting SNF transfer.  Objective:  Vital signs in last 24 hours: Vitals:   05/28/19 1404 05/28/19 1953 05/28/19 1958 05/29/19 0453  BP: (!) 127/58  123/66 (!) 135/55  Pulse: (!) 49  (!) 57 62  Resp: 18  18 16   Temp: 98.3 F (36.8 C)  98.3 F (36.8 C) 97.7 F (36.5 C)  TempSrc: Oral  Oral Oral  SpO2: 96% 95% 99% 96%  Weight:      Height:       Physical Exam Vitals signs and nursing note reviewed.  Constitutional:      General: She is not in acute distress.    Appearance: She is obese. She is not ill-appearing.  Pulmonary:     Effort: Pulmonary effort is normal.  Musculoskeletal:     Comments: RLE with stitches in place and lower leg wrapped in ACE bandage. Sensation intact and able to move toes.  Skin:    General: Skin is warm and dry.  Neurological:     Mental Status: She is alert.    Assessment/Plan:  Principal Problem:   Right tibial fracture Active Problems:   Morbid obesity with BMI of 40.0-44.9, adult (HCC)   Chronic atrial fibrillation (HCC)   Physical debility   Type 2 diabetes mellitus with hypercholesterolemia ([place])   Ms. Delpriore is a 78year old Fwith permanent A Fibnot on anticoagulation,nonischemic cardiomyopathy,Type 2 Diabetes,COPD,HTN,andlumbar degeneration, who presented after a mechanicalfallresulting in aright tibia and fibular fracture .    Right Tibia/Fibula Fractures/p ORIF on 11/6- Post-op day4 Used 1 PRN pain medication overnight. - Orthopedic following - pt non weight bearing in RLE in PRAFO boot in bed and when up with PT - enoxaparin forDVT ppx post-operatively - mobilize with PT             - f/u in 2-3 weeks post discharge for imaging and suture removal - pain management with tylenol 100mg  q6h, Dilaudid 0.5-1mg  q3hrsPRN, robaxin 500mg  q6h  PRN, andtramadol 50-100mg  q6h PRN  Continue DVT ppx at SNF with ASA 81mg  and enoxaparin 40mg  subQ daily for 4 weeks post-op till 06/25/2019.  Mechanical Fall - resulting in fragility fracture - PT/OTrecommending SNF placement - social work awaiting insurance authorization to [employer] - follow-upwith PCPfor initiation of bisphosphonate therapy on 06/25/2019  AKI - resolved Baseline Cr 1.08 in 12/2018. Cr on admission 1.3, initially improved and now back to 0.9. Urine output 2.7L yesterday.  - renal US on 11/9 negative for obstruction or medical renal disease - holding homefurosemide  Type 2Diabetes Mellitus - uncontrolled A1c 14.6on admission. Sugars remain elevated. Tight Glc control post-operatively will optimize healing. - increase Lantus to 75u qhs -increase novolog to 11u TID w/ meals -moderate SSI -CBGmonitoring  Permanent Atrial Fibrillation- stable Rate controlledwith digoxin. CHADS2-VAScscore 6, not onanticoagulation due to history of GI bleed. - continue digoxin 148mcg every other day - dig level 0.2 yesterday, will defer to outpatient cardiology for potential dose adjustment  HFrEF -holding homefurosemide80mg PO daily - continue digoxin 124mcg every other day  HTN - holding homelisinopril  Diet -carb modified Fluids -none DVT ppx -enoxaparin 40mg  subQ daily CODE STATUS -FULL CODE  Dispo: Anticipated dischargepending insurance authorization for SNF. COVID test negative on 11/9.   Ladona Horns, MD 05/29/2019, 5:59 AM Pager: [phone]

## 2019-05-29 NOTE — Plan of Care (Signed)
  Problem: Education: Goal: Knowledge of General Education information will improve Description: Including pain rating scale, medication(s)/side effects and non-pharmacologic comfort measures Outcome: Progressing   Problem: Education: Goal: Knowledge of General Education information will improve Description: Including pain rating scale, medication(s)/side effects and non-pharmacologic comfort measures Outcome: Progressing   Problem: Activity: Goal: Risk for activity intolerance will decrease Outcome: Progressing   Problem: Nutrition: Goal: Adequate nutrition will be maintained Outcome: Progressing   Problem: Coping: Goal: Level of anxiety will decrease Outcome: Progressing   Problem: Pain Managment: Goal: General experience of comfort will improve Outcome: Progressing   Problem: Safety: Goal: Ability to remain free from injury will improve Outcome: Progressing   Problem: Skin Integrity: Goal: Risk for impaired skin integrity will decrease Outcome: Progressing

## 2019-05-29 NOTE — Plan of Care (Signed)
  Problem: Education: Goal: Knowledge of General Education information will improve Description: Including pain rating scale, medication(s)/side effects and non-pharmacologic comfort measures Outcome: Progressing   Problem: Clinical Measurements: Goal: Will remain free from infection Outcome: Progressing   Problem: Activity: Goal: Risk for activity intolerance will decrease Outcome: Progressing   Problem: Coping: Goal: Level of anxiety will decrease Outcome: Progressing   Problem: Pain Managment: Goal: General experience of comfort will improve Outcome: Progressing   Problem: Safety: Goal: Ability to remain free from injury will improve Outcome: Progressing   Problem: Skin Integrity: Goal: Risk for impaired skin integrity will decrease Outcome: Progressing   Problem: Physical Regulation: Goal: Postoperative complications will be avoided or minimized Outcome: Progressing

## 2019-05-30 DIAGNOSIS — M80061A Age-related osteoporosis with current pathological fracture, right lower leg, initial encounter for fracture: Secondary | ICD-10-CM

## 2019-05-30 DIAGNOSIS — Z888 Allergy status to other drugs, medicaments and biological substances status: Secondary | ICD-10-CM

## 2019-05-30 DIAGNOSIS — E11649 Type 2 diabetes mellitus with hypoglycemia without coma: Secondary | ICD-10-CM

## 2019-05-30 DIAGNOSIS — Z885 Allergy status to narcotic agent status: Secondary | ICD-10-CM

## 2019-05-30 DIAGNOSIS — I10 Essential (primary) hypertension: Secondary | ICD-10-CM

## 2019-05-30 DIAGNOSIS — Z881 Allergy status to other antibiotic agents status: Secondary | ICD-10-CM

## 2019-05-30 DIAGNOSIS — Z91048 Other nonmedicinal substance allergy status: Secondary | ICD-10-CM

## 2019-05-30 LAB — BASIC METABOLIC PANEL
Anion gap: 8 (ref 5–15)
BUN: 33 mg/dL — ABNORMAL HIGH (ref 8–23)
CO2: 25 mmol/L (ref 22–32)
Calcium: 8.7 mg/dL — ABNORMAL LOW (ref 8.9–10.3)
Chloride: 100 mmol/L (ref 98–111)
Creatinine, Ser: 0.95 mg/dL (ref 0.44–1.00)
GFR calc Af Amer: >60 mL/min (ref >60)
GFR calc non Af Amer: 58 mL/min — ABNORMAL LOW (ref >60)
Glucose, Bld: 66 mg/dL — ABNORMAL LOW (ref 70–99)
Potassium: 4.9 mmol/L (ref 3.5–5.1)
Sodium: 133 mmol/L — ABNORMAL LOW (ref 135–145)

## 2019-05-30 LAB — GLUCOSE, CAPILLARY
Glucose-Capillary: 48 mg/dL — ABNORMAL LOW (ref 70–99)
Glucose-Capillary: 71 mg/dL (ref 70–99)
Glucose-Capillary: 150 mg/dL — ABNORMAL HIGH (ref 70–99)
Glucose-Capillary: 89 mg/dL (ref 70–99)

## 2019-05-30 MED ORDER — ASPIRIN 81 MG PO CHEW: 81.0000 mg | CHEWABLE_TABLET | Freq: Every day | ORAL | 0 refills | Status: AC

## 2019-05-30 MED ORDER — INSULIN ASPART 100 UNIT/ML ~~LOC~~ SOLN
7.0000 [IU] | Freq: Three times a day (TID) | SUBCUTANEOUS | Status: DC
  Administered 2019-05-30: 7 [IU] via SUBCUTANEOUS

## 2019-05-30 MED ORDER — SENNOSIDES-DOCUSATE SODIUM 8.6-50 MG PO TABS: 1.0000 | ORAL_TABLET | Freq: Every evening | ORAL | 0 refills | Status: AC | PRN

## 2019-05-30 MED ORDER — ENOXAPARIN SODIUM 40 MG/0.4ML ~~LOC~~ SOLN: 40.0000 mg | SUBCUTANEOUS | 0 refills | Status: AC

## 2019-05-30 MED ORDER — HYDROCODONE-ACETAMINOPHEN 5-325 MG PO TABS: 1.0000 | ORAL_TABLET | ORAL | 0 refills | Status: AC | PRN

## 2019-05-30 MED ORDER — CLOTRIMAZOLE 1 % EX CREA: TOPICAL_CREAM | Freq: Two times a day (BID) | CUTANEOUS | 0 refills | Status: AC

## 2019-05-30 MED ORDER — ACETAMINOPHEN 500 MG PO TABS: 1000.0000 mg | ORAL_TABLET | Freq: Four times a day (QID) | ORAL | 0 refills | Status: AC

## 2019-05-30 MED ORDER — INSULIN GLARGINE 100 UNIT/ML ~~LOC~~ SOLN
65.0000 [IU] | Freq: Every day | SUBCUTANEOUS | Status: DC
  Administered 2019-05-30: 65 [IU] via SUBCUTANEOUS
  Filled 2019-05-30: qty 0.65

## 2019-05-30 NOTE — Plan of Care (Signed)
  Problem: Education: Goal: Knowledge of General Education information will improve Description: Including pain rating scale, medication(s)/side effects and non-pharmacologic comfort measures Outcome: Progressing   Problem: Education: Goal: Knowledge of General Education information will improve Description: Including pain rating scale, medication(s)/side effects and non-pharmacologic comfort measures Outcome: Progressing   Problem: Health Behavior/Discharge Planning: Goal: Ability to manage health-related needs will improve Outcome: Progressing   Problem: Activity: Goal: Risk for activity intolerance will decrease Outcome: Progressing

## 2019-05-30 NOTE — TOC Transition Note (Signed)
Transition of Care [facility]) - CM/SW Discharge Note   Patient Details  Name: Carly Rosario MRN: [id number] Date of Birth: 1941-01-19  [facility] CM/SW Contact:  Midge Minium RN, BSN, NCM-BC, ACM-RN [phone] Phone Number: 05/30/2019, 11:44 AM   Clinical Narrative:    Patient is medically stable to transition to [facility] SNF for rehab. CM updated patient/[name] (son) with both agreeable to transfer to SNF for rehab. PTAR will be arranged for 1400.  Please call report to: [phone]; room 8B    Final next level of care: [place] Barriers to Discharge: No Barriers Identified   Patient Goals and CMS Choice   CMS Medicare.gov Compare Post Acute Care list provided to:: Patient Choice offered to / list presented to : Patient  Discharge Placement              Patient chooses bed at: [facility] Patient to be transferred to facility by: [place] Name of family member notified: patient/[name] (son) Patient and family notified of of transfer: 05/30/19   Social Determinants of Health (SDOH) Interventions     Readmission Risk Interventions No flowsheet data found.

## 2019-05-30 NOTE — Progress Notes (Signed)
Physical Therapy Treatment Patient Details Name: Carly Rosario MRN: [id number] DOB: 29-Aug-1940 Today's Date: 05/30/2019    History of Present Illness Pt is a 78 y/o female s/p R distal tib/fib fx ORIF secondary to a fall at home. PMH including but not limited to CHF, COPD, HTN and DM.    PT Comments    Pt admitted with above diagnosis. Pt was able to pivot to chair to her left with mod assist of 2 and PFRW.  Pt gets fatigued quickly.  Will continue PT and progress pt as able.  Pt currently with functional limitations due to the deficits listed below (see PT Problem List). Pt will benefit from skilled PT to increase their independence and safety with mobility to allow discharge to the venue listed below.     Follow Up Recommendations  SNF     Equipment Recommendations  None recommended by PT    Recommendations for Other Services       Precautions / Restrictions Precautions Precautions: Fall Restrictions Weight Bearing Restrictions: Yes RLE Weight Bearing: Non weight bearing    Mobility  Bed Mobility Overal bed mobility: Needs Assistance Bed Mobility: Supine to Sit     Supine to sit: Supervision     General bed mobility comments: supervision for safety; pt achieving sitting position towards her R side  Transfers Overall transfer level: Needs assistance Equipment used: Bilateral platform walker Transfers: Sit to/from Stand;Stand Pivot Transfers Sit to Stand: +2 physical assistance;Min assist;From elevated surface;Mod assist Stand pivot transfers: Mod assist;Max assist;+2 physical assistance       General transfer comment: pt able to stand from elevated EOB with min assist of 2 persons for safety.  Needed cues for hand placement and allowed pt to place one elbow on platform and the other on the bed to push up from.  Pt then stood to be cleaned as her purewick had leaked.  Attempted to transfer to her right and pt unable to pivot/hop to her right even with the PFRW.   Moved chair to the pts left side and had her stand again and pivot to her left with pt needing +2 mod assist and cues for safety as well.  Pt with poor safety towards the end of transfer needing cues to get close enough to chair with pt needing control of descent into chair.    Ambulation/Gait             General Gait Details: unable to progress gait as of yet due to poor endurance with pt only able to stand to [facility] or PFRW and pivot   Stairs             Wheelchair Mobility    Modified Rankin (Stroke Patients Only)       Balance Overall balance assessment: Needs assistance;History of Falls Sitting-balance support: Feet supported;No upper extremity supported Sitting balance-Leahy Scale: Fair     Standing balance support: Bilateral upper extremity supported Standing balance-Leahy Scale: Poor Standing balance comment: reliant on bilateral UEs and external support                            Cognition Arousal/Alertness: Awake/alert Behavior During Therapy: WFL for tasks assessed/performed Overall Cognitive Status: Within Functional Limits for tasks assessed  Exercises General Exercises - Lower Extremity Ankle Circles/Pumps: AROM;Both;10 reps;Supine Quad Sets: AROM;Both;10 reps;Supine Long Arc Quad: AROM;Both;10 reps;Seated Heel Slides: AROM;Both;10 reps;Supine Hip Flexion/Marching: AROM;Both;10 reps;Seated    General Comments General comments (skin integrity, edema, etc.): Placed PRAFO on pt.       Pertinent Vitals/Pain Pain Assessment: Faces Faces Pain Scale: Hurts even more Pain Location: R ankle/foot Pain Descriptors / Indicators: Guarding Pain Intervention(s): Limited activity within patient's tolerance;Monitored during session;Repositioned;Patient requesting pain meds-RN notified    Home Living                      Prior Function            PT Goals (current goals can now  be found in the care plan section) Acute Rehab PT Goals Patient Stated Goal: to get stronger Progress towards PT goals: Progressing toward goals    Frequency    Min 2X/week      PT Plan Frequency needs to be updated    Co-evaluation              AM-PAC PT "6 Clicks" Mobility   Outcome Measure  Help needed turning from your back to your side while in a flat bed without using bedrails?: None Help needed moving from lying on your back to sitting on the side of a flat bed without using bedrails?: None Help needed moving to and from a bed to a chair (including a wheelchair)?: A Lot Help needed standing up from a chair using your arms (e.g., wheelchair or bedside chair)?: A Lot Help needed to walk in hospital room?: Total Help needed climbing 3-5 steps with a railing? : Total 6 Click Score: 14    End of Session Equipment Utilized During Treatment: Gait belt Activity Tolerance: Patient tolerated treatment well Patient left: in chair;with call bell/phone within reach;with chair alarm set Nurse Communication: Mobility status;Need for lift equipment(use Stedy) PT Visit Diagnosis: Other abnormalities of gait and mobility (R26.89)     Time: 4132-4401 PT Time Calculation (min) (ACUTE ONLY): 26 min  Charges:  $Therapeutic Exercise: 8-22 mins $Therapeutic Activity: 8-22 mins                     Manda Holstad W,PT Acute Rehabilitation Services Pager:  [phone]  Office:  [place] 05/30/2019, 10:08 AM

## 2019-05-30 NOTE — Progress Notes (Signed)
   Subjective: Pt seen at the bedside this morning after working with PT. She is feeling well this morning, able to eat all of her breakfast tray this morning. No acute concerns, ready for discharge to SNF today.  Had 2 hypoglycemic episodes - once last evening and again this morning. Resolved with ice cream and juice.   Objective:  Vital signs in last 24 hours: Vitals:   05/29/19 1700 05/29/19 1929 05/29/19 2037 05/30/19 0359  BP: 120/80  (!) 147/63 (!) 116/56  Pulse: 60  68 70  Resp: 18   14  Temp: 98 F (36.7 C)  98.2 F (36.8 C) 98.4 F (36.9 C)  TempSrc: Oral  Oral Oral  SpO2: 95% 92% 97% 91%  Weight:      Height:       Physical Exam Vitals signs and nursing note reviewed.  Constitutional:      General: She is not in acute distress.    Appearance: She is not ill-appearing.  Pulmonary:     Effort: Pulmonary effort is normal.  Musculoskeletal:     Right lower leg: No edema.     Left lower leg: No edema.     Comments: PRAFO boot in place.  Skin:    General: Skin is warm and dry.  Neurological:     Mental Status: She is alert.    Assessment/Plan:  Principal Problem:   Right tibial fracture Active Problems:   Morbid obesity with BMI of 40.0-44.9, adult (HCC)   Chronic atrial fibrillation (HCC)   Physical debility   Type 2 diabetes mellitus with hypercholesterolemia ([place])  Ms. Calame is a 78year old Fwith permanent A Fibnot on anticoagulation,nonischemic cardiomyopathy,Type 2 Diabetes,COPD,HTN,andlumbar degeneration, who presented after a mechanicalfallresulting in aright tibia and fibular fracture .    Right Tibia/Fibula Fracturepost-op day 5 from ORIF on 11/6 Used 1 PRN pain medication overnight. - Orthopedics recommending - pt non weight bearing in RLE in PRAFO bootin bed and when up with PT - enoxaparin forDVT ppx post-operatively - f/u in 2-3 weeks post discharge for imaging and suture removal -  current pain management regimen;tylenol 100mg  q6h,Dilaudid 0.5-1mg  q3hrsPRN, robaxin 500mg  q6h PRN, andtramadol 50-100mg  q6h PRN  DVT ppx at SNF with ASA 81mg  and enoxaparin 40mg  subQ daily for 4 weeks post-op till 06/25/2019.   Mechanical Fall - resulting in fragility fracture - PT/OTrecommended SNF placement - insurance authorization for SNF approved 11/10, plan for d/c today - Initiate bisphosphonate therapy on 06/25/2019, and follow-up with PCP   Type 2Diabetes Mellitus - uncontrolled, A1c 14.6on admission.  Pt with hypoglycemic episodes last evening and this morning. Suspect related to increased basal insulin dose yesterday morning and change in diet while inpatient. - decrease Lantusto 65u qhs -decreasenovologto 7u TID w/ meals -moderate SSI -CBGmonitoring  Permanent Atrial Fibrillation- stable CHADS2-VAScscore 6, not onanticoagulation due to history of GI bleed. - continue digoxin 11mcg every other day - dig level 0.2 on 11/10, will defer to outpatient cardiology for potential dose adjustment   Diet -carb modified Fluids -none DVT ppx -enoxaparin 40mg  subQ daily CODE STATUS -FULL CODE   Dispo: Anticipated discharge to SNF today   Ladona Horns, MD 05/30/2019, 6:01 AM Pager: [phone]

## 2019-05-30 NOTE — Progress Notes (Signed)
CBG at 0630 = 48  Patient given 1 cup of apple juice and 1 cup of ice cream. Asymptomatic  Rechecked CBG at 0650= 71  Patient given an additional cup of apple juice.  MD notified

## 2019-05-30 NOTE — Plan of Care (Signed)
  Problem: Education: Goal: Knowledge of General Education information will improve Description: Including pain rating scale, medication(s)/side effects and non-pharmacologic comfort measures Outcome: Progressing   Problem: Education: Goal: Knowledge of General Education information will improve Description: Including pain rating scale, medication(s)/side effects and non-pharmacologic comfort measures Outcome: Progressing   Problem: Health Behavior/Discharge Planning: Goal: Ability to manage health-related needs will improve Outcome: Progressing   Problem: Clinical Measurements: Goal: Ability to maintain clinical measurements within normal limits will improve Outcome: Progressing   Problem: Activity: Goal: Risk for activity intolerance will decrease Outcome: Progressing   Problem: Nutrition: Goal: Adequate nutrition will be maintained Outcome: Progressing   Problem: Coping: Goal: Level of anxiety will decrease Outcome: Progressing   Problem: Elimination: Goal: Will not experience complications related to bowel motility Outcome: Progressing   Problem: Pain Managment: Goal: General experience of comfort will improve Outcome: Progressing   Problem: Safety: Goal: Ability to remain free from injury will improve Outcome: Progressing   Problem: Skin Integrity: Goal: Risk for impaired skin integrity will decrease Outcome: Progressing   

## 2019-05-30 NOTE — Progress Notes (Addendum)
Called [facility] and gave report to [place], LPN. Discharge instructions left on top of patient chart for PTAR to pick up. All questions and concerns answered and awaiting pickup from PTAR. Will continue to monitor.  Pt left the unit with PTAR at 1743
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
## 2019-05-30 NOTE — Progress Notes (Signed)
Occupational Therapy Treatment Patient Details Name: Carly Rosario MRN: [id number] DOB: [birth year] Today's Date: 05/30/2019    History of present illness Pt is a 78 y/o female s/p R distal tib/fib fx ORIF secondary to a fall at home. PMH including but not limited to CHF, COPD, HTN and DM.   OT comments  Pt making steady progress towards OT goals this session. Pt required MOD A +2 for sit>stand from recliner with RW. Pt needing increased assist to stand from low surface and required cues for hand placement as pt trying to pull up on sara stedy bar. Transferred pt from recliner>EOB via stedy. Pt with incontinent episode during transfer needing to be returned to supine to assist with clean up. Pt able to complete LB bathing from supine with HOB elevated; MOD A for cleanliness of task. DC plan remains appropriate, will continue to follow acutely per POC.    Follow Up Recommendations  SNF    Equipment Recommendations  3 in 1 bedside commode    Recommendations for Other Services      Precautions / Restrictions Precautions Precautions: Fall Restrictions Weight Bearing Restrictions: Yes RLE Weight Bearing: Non weight bearing       Mobility Bed Mobility Overal bed mobility: Needs Assistance Bed Mobility: Sit to Supine;Rolling Rolling: Min guard     Sit to supine: Min assist   General bed mobility comments: MIN A for sit>supine to lift RLE back onto bed; pt rolled to assist with pericare, MIN guard for rolling  Transfers Overall transfer level: Needs assistance   Transfers: Sit to/from Stand Sit to Stand: +2 physical assistance;Mod assist(from low surface of chair; cued to push up from chair rather than pull up on stedy)         General transfer comment: attempted sit>stand 1st time with pt pulling up on bar of stedy and therapist and tech providing MAX A+2,- pt unable to stand, cued pt to push up from recliner- pt able to stand with MOD A +2, used stedy to transfer pt back to  bed    Balance Overall balance assessment: Needs assistance;History of Falls Sitting-balance support: Feet supported;No upper extremity supported Sitting balance-Leahy Scale: Fair Sitting balance - Comments: close min guard   Standing balance support: Bilateral upper extremity supported Standing balance-Leahy Scale: Poor Standing balance comment: reliant on bilateral UEs and external support                           ADL either performed or assessed with clinical judgement   ADL Overall ADL's : Needs assistance/impaired             Lower Body Bathing: Moderate assistance;Bed level;+2 for safety/equipment Lower Body Bathing Details (indicate cue type and reason): able to wash in between legs from bed level with HOB elevated; MOD A for cleanliness         Toilet Transfer: Stand-pivot;+2 for physical assistance;+2 for safety/equipment(stedy) Toilet Transfer Details (indicate cue type and reason): simulated transfer from recliner to EOB with use of sara stedy; pt required MOD A +2 for sit>stand, cues for pt to push from recliner rather than pull up on bar         Functional mobility during ADLs: Moderate assistance;+2 for physical assistance(sara stedy) General ADL Comments: pt with decreased balance, strength, and activity tolerance impacting safety and independence with ADL; session focus on sit>stand transfer with stedy; LB bathing after incontient episode     Vision Baseline Vision/History: No  visual deficits     Perception     Praxis      Cognition Arousal/Alertness: Awake/alert Behavior During Therapy: WFL for tasks assessed/performed;Anxious Overall Cognitive Status: Within Functional Limits for tasks assessed                                 General Comments: pt anxious about current level of funciton, worried shes not progressing well        Exercises     Shoulder Instructions       General Comments      Pertinent Vitals/  Pain       Pain Assessment: No/denies pain  Home Living                                          Prior Functioning/Environment              Frequency  Min 2X/week        Progress Toward Goals  OT Goals(current goals can now be found in the care plan section)  Progress towards OT goals: Progressing toward goals  Acute Rehab OT Goals Patient Stated Goal: to get stronger OT Goal Formulation: With patient Time For Goal Achievement: 06/09/19 Potential to Achieve Goals: Good  Plan Discharge plan remains appropriate    Co-evaluation                 AM-PAC OT "6 Clicks" Daily Activity     Outcome Measure   Help from another person eating meals?: A Little Help from another person taking care of personal grooming?: A Little Help from another person toileting, which includes using toliet, bedpan, or urinal?: A Lot Help from another person bathing (including washing, rinsing, drying)?: A Lot Help from another person to put on and taking off regular upper body clothing?: A Little Help from another person to put on and taking off regular lower body clothing?: A Lot 6 Click Score: 15    End of Session Equipment Utilized During Treatment: Gait belt;Other (comment)(sara stedy)  OT Visit Diagnosis: Unsteadiness on feet (R26.81);Other abnormalities of gait and mobility (R26.89);Muscle weakness (generalized) (M62.81);History of falling (Z91.81);Pain Pain - Right/Left: Right Pain - part of body: Leg   Activity Tolerance Patient tolerated treatment well   Patient Left in bed;with call bell/phone within reach;with bed alarm set   Nurse Communication Mobility status;Other (comment)(requesting cream on back side)        Time: 7829-5621 OT Time Calculation (min): 28 min  Charges: OT General Charges $OT Visit: 1 Visit OT Treatments $Self Care/Home Management : 8-22 mins $Therapeutic Activity: 8-22 mins  Audery Amel., COTA/L Acute Rehabilitation  Services [phone]    Angelina Pih 05/30/2019, 3:21 PM

## 2020-03-20 IMAGING — RF DG C-ARM 1-60 MIN
1 series · 6 of 6 positions shown · non-contrast
Comparison: 05/24/2019 fluoroscopy time

FLUOROSCOPY TIME:  1 minutes 42 seconds

Images obtained: 7

CLINICAL DATA: RIGHT ankle fractures, ORIF

EXAM:
RIGHT ANKLE - COMPLETE 3+ VIEW; DG C-ARM 1-60 MIN

[Series 1: run · 6 of 6 slices shown]
[im 1/6]
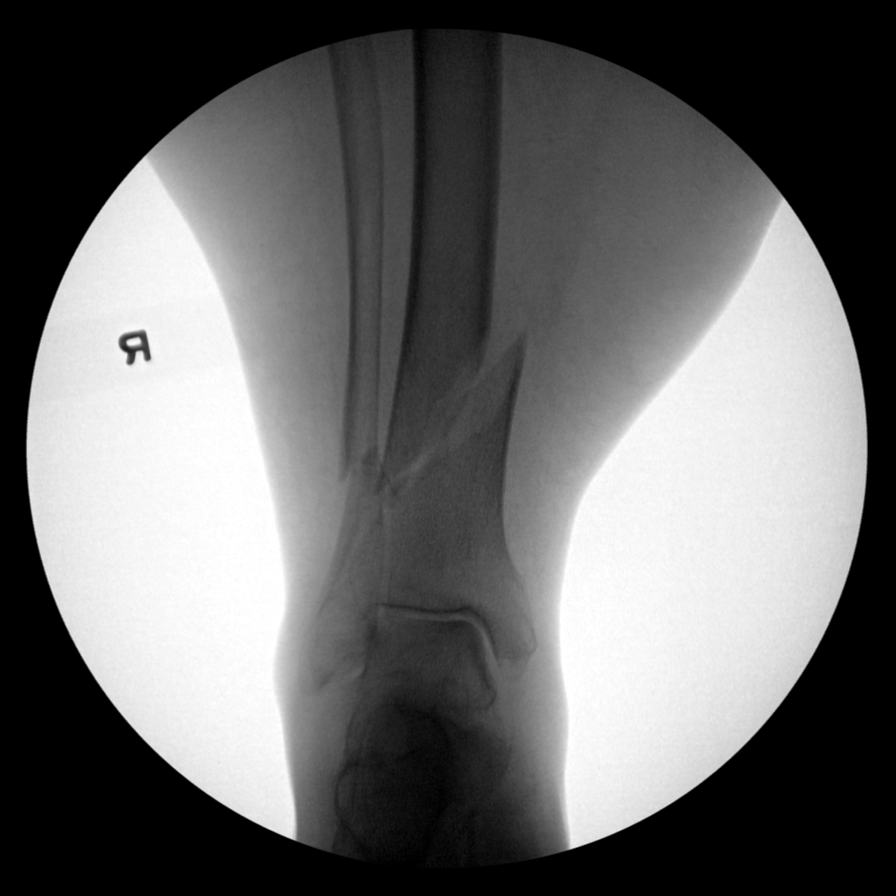
[im 2/6]
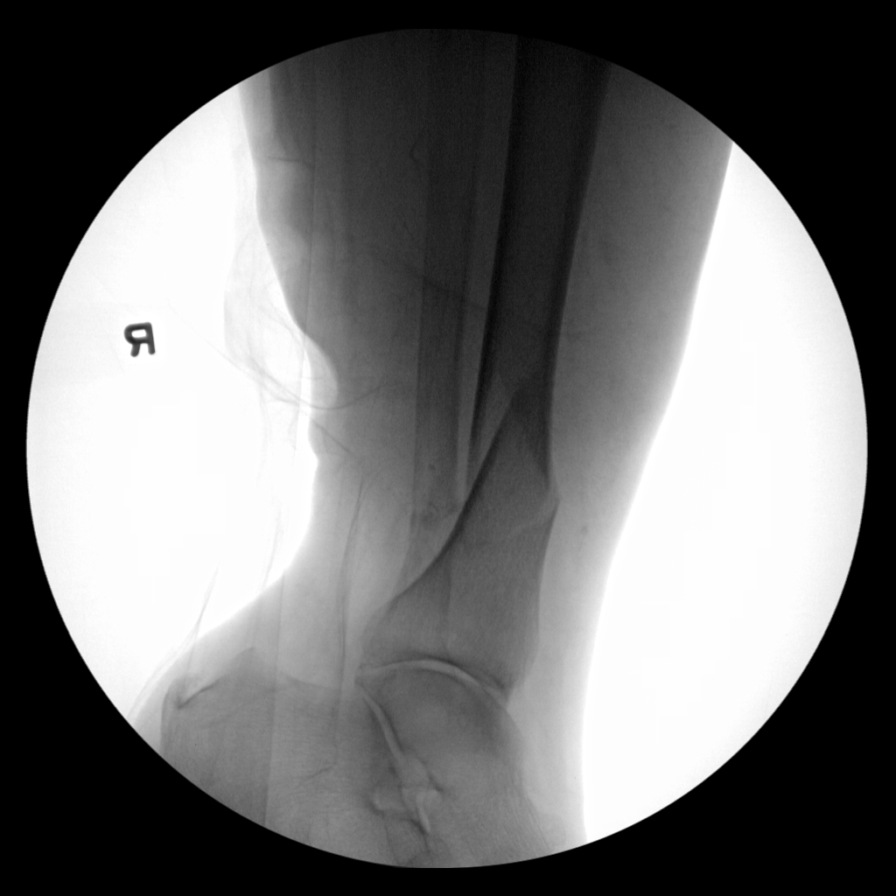
[im 3/6]
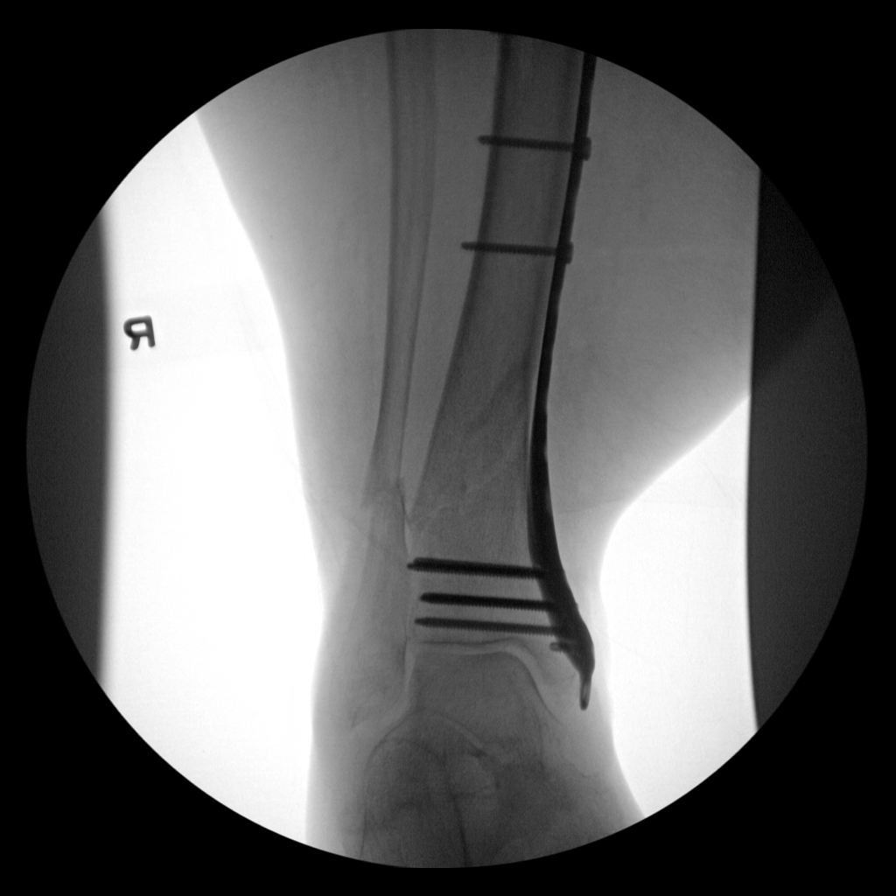
[im 4/6]
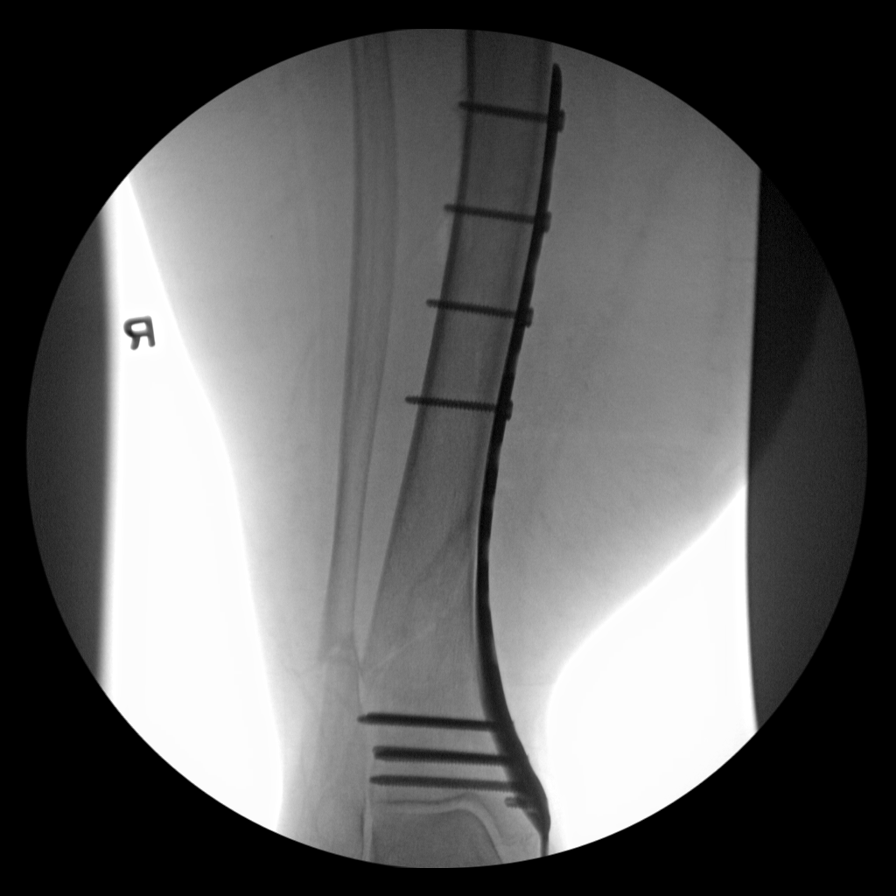
[im 5/6]
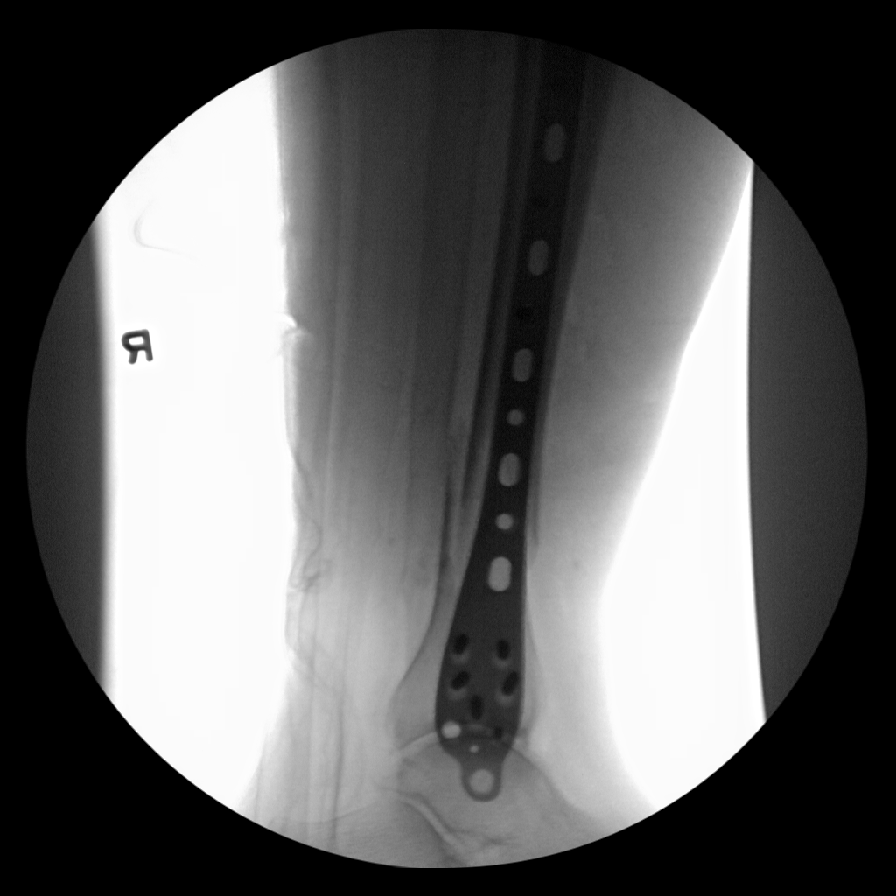
[im 6/6]
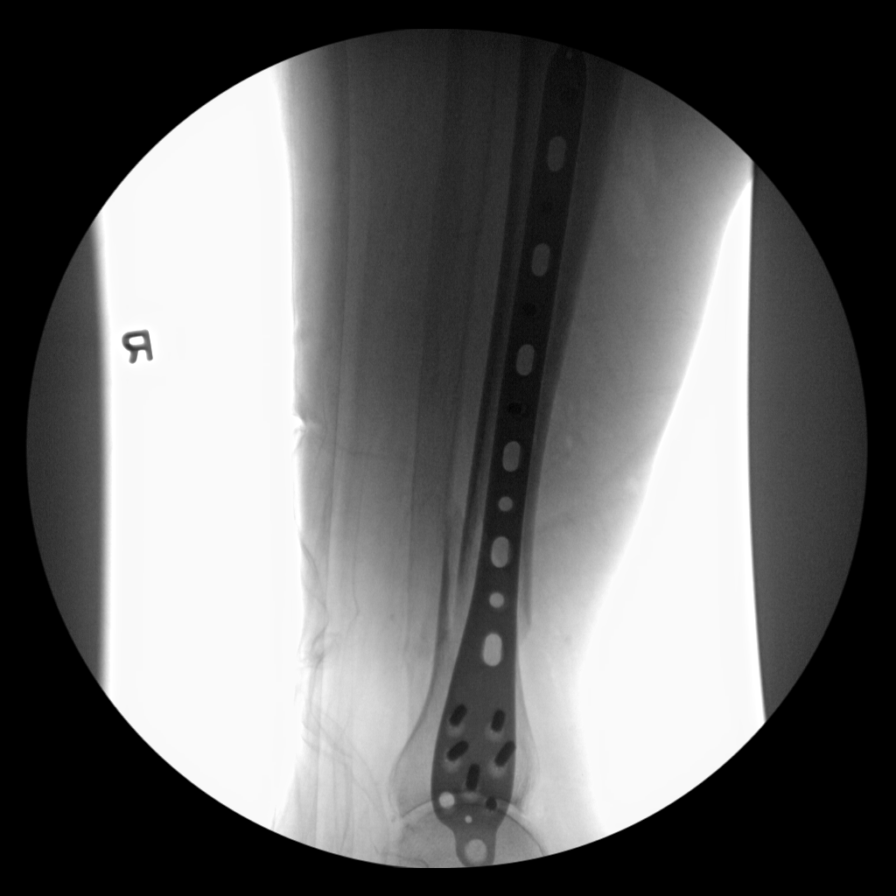

[6 of 6 positions shown; findings below may reference images not displayed]

FINDINGS: Osseous demineralization.

Medial plate and multiple screws were placed across a reduced
oblique fracture of the distal RIGHT tibial metadiaphysis.

Ankle joint alignment normal.

Minimally displaced distal RIGHT fibular diaphyseal fracture.
IMPRESSION: Post distal RIGHT tibial ORIF.

Minimally displaced distal RIGHT fibular diaphyseal fracture.

## 2021-10-17 DEATH — deceased
